# Patient Record
Sex: Female | Born: 1960 | Race: White | Hispanic: No | Smoking: Current every day smoker
Health system: Southern US, Community
[De-identification: ages and names within clinical notes are randomized; demographics above are authoritative.]

## PROBLEM LIST (undated history)

## (undated) DIAGNOSIS — F3181 Bipolar II disorder: Secondary | ICD-10-CM

## (undated) DIAGNOSIS — F329 Major depressive disorder, single episode, unspecified: Secondary | ICD-10-CM

## (undated) DIAGNOSIS — M25552 Pain in left hip: Secondary | ICD-10-CM

## (undated) DIAGNOSIS — F32A Depression, unspecified: Secondary | ICD-10-CM

## (undated) HISTORY — DX: Pain in left hip: M25.552

## (undated) HISTORY — DX: Major depressive disorder, single episode, unspecified: F32.9

## (undated) HISTORY — DX: Depression, unspecified: F32.A

## (undated) HISTORY — DX: Bipolar II disorder: F31.81

## (undated) HISTORY — DX: Other disorders of iron metabolism: E83.19

---

## 2017-06-01 DIAGNOSIS — R002 Palpitations: Secondary | ICD-10-CM | POA: Diagnosis not present

## 2017-06-02 ENCOUNTER — Ambulatory Visit: Payer: Self-pay | Admitting: Family Medicine

## 2017-07-12 ENCOUNTER — Ambulatory Visit: Payer: Self-pay | Admitting: Internal Medicine

## 2017-08-31 ENCOUNTER — Ambulatory Visit: Payer: Self-pay | Admitting: Internal Medicine

## 2018-01-24 ENCOUNTER — Ambulatory Visit: Payer: Commercial Managed Care - PPO | Admitting: Family Medicine

## 2018-01-24 ENCOUNTER — Encounter: Payer: Self-pay | Admitting: Family Medicine

## 2018-01-24 VITALS — BP 107/64 | HR 88 | Temp 98.8°F | Ht 68.3 in | Wt 172.8 lb

## 2018-01-24 DIAGNOSIS — H60331 Swimmer's ear, right ear: Secondary | ICD-10-CM

## 2018-01-24 DIAGNOSIS — M1612 Unilateral primary osteoarthritis, left hip: Secondary | ICD-10-CM | POA: Diagnosis not present

## 2018-01-24 DIAGNOSIS — Z79899 Other long term (current) drug therapy: Secondary | ICD-10-CM | POA: Diagnosis not present

## 2018-01-24 DIAGNOSIS — F3181 Bipolar II disorder: Secondary | ICD-10-CM | POA: Diagnosis not present

## 2018-01-24 LAB — UA/M W/RFLX CULTURE, ROUTINE
Bilirubin, UA: NEGATIVE
Glucose, UA: NEGATIVE
Ketones, UA: NEGATIVE
Leukocytes, UA: NEGATIVE
Nitrite, UA: NEGATIVE
Protein, UA: NEGATIVE
RBC, UA: NEGATIVE
Specific Gravity, UA: 1.01 (ref 1.005–1.030)
Urobilinogen, Ur: 0.2 mg/dL (ref 0.2–1.0)
pH, UA: 5.5 (ref 5.0–7.5)

## 2018-01-24 LAB — BAYER DCA HB A1C WAIVED: HB A1C (BAYER DCA - WAIVED): 4.6 % (ref <7.0)

## 2018-01-24 MED ORDER — CIPROFLOXACIN-DEXAMETHASONE 0.3-0.1 % OT SUSP: 4.0000 [drp] | Freq: Two times a day (BID) | OTIC | 0 refills | Status: DC

## 2018-01-24 MED ORDER — IBUPROFEN 800 MG PO TABS: ORAL_TABLET | ORAL | 6 refills | Status: DC

## 2018-01-24 NOTE — Assessment & Plan Note (Signed)
Doing well with ibuprofen. Refill given today. Call if she'd like to see ortho.

## 2018-01-24 NOTE — Progress Notes (Signed)
BP 107/64   Pulse 88   Temp 98.8 F (37.1 C) (Oral)   Ht 5' 8.3" (1.735 m)   Wt 172 lb 12.8 oz (78.4 kg)   SpO2 95%   BMI 26.04 kg/m    Subjective:    Patient ID: Laura Haney, female    DOB: 07-07-1960, 57 y.o.   MRN: [id number]  HPI: Laura Haney is a 57 y.o. female who presents today to establish care,   Chief Complaint  Patient presents with  . Establish Care    pt states she would like her ears checked, states she noticed an odor from them    EAR SMELL Duration: 6 months Involved ear(s): bilateral Severity:  Itching only  Quality:  itching Fever: no Otorrhea: yes Upper respiratory infection symptoms: no Pruritus: yes Hearing loss: no Water immersion no Using Q-tips: no Recurrent otitis media: no Status: stable Treatments attempted: none   Known arthritis in her L hip- has been doing well with it as long as she takes her ibuprofen.  Known bipolar- was treated in [place]. Call with any concerns.    Active Ambulatory Problems    Diagnosis Date Noted  . Bipolar 2 disorder (HCC) 01/24/2018  . Primary osteoarthritis of left hip 01/24/2018   Resolved Ambulatory Problems    Diagnosis Date Noted  . No Resolved Ambulatory Problems   Past Medical History:  Diagnosis Date  . Depression   . Iron excess   . Left hip pain    History reviewed. No pertinent surgical history.  Outpatient Encounter Medications as of 01/24/2018  Medication Sig  . DULoxetine (CYMBALTA) 60 MG capsule TK 1 C PO QAM  . ibuprofen (ADVIL,MOTRIN) 800 MG tablet TK 1 T PO TID AFTER MEALS  . lamoTRIgine (LAMICTAL) 150 MG tablet TK 1 T PO QHS  . QUEtiapine (SEROQUEL) 100 MG tablet TK 2 TS PO QHS  . [DISCONTINUED] ibuprofen (ADVIL,MOTRIN) 800 MG tablet TK 1 T PO TID AFTER MEALS  . ciprofloxacin-dexamethasone (CIPRODEX) OTIC suspension Place 4 drops into the right ear 2 (two) times daily.   No facility-administered encounter medications on file as of 01/24/2018.    Allergies  Allergen  Reactions  . Penicillins Other (See Comments)    Bad yeast infections   Social History   Socioeconomic History  . Marital status: Unknown    Spouse name: Not on file  . Number of children: Not on file  . Years of education: Not on file  . Highest education level: Not on file  Occupational History  . Not on file  Social Needs  . Financial resource strain: Not on file  . Food insecurity:    Worry: Not on file    Inability: Not on file  . Transportation needs:    Medical: Not on file    Non-medical: Not on file  Tobacco Use  . Smoking status: Former Smoker    Packs/day: 1.50    Types: Cigarettes  . Smokeless tobacco: Never Used  Substance and Sexual Activity  . 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 Drug use: Yes    Types: Marijuana    Comment: occasionally  . Sexual activity: Yes  Lifestyle  . Physical activity:    Days per week: Not on file    Minutes per session: Not on file  . Stress: Not on file  Relationships  . Social connections:    Talks on phone: Not on file    Gets together:  Not on file    Attends religious service: Not on file    Active member of club or organization: Not on file    Attends meetings of clubs or organizations: Not on file    Relationship status: Not on file  Other Topics Concern  . Not on file  Social History Narrative  . Not on file   Family History  Problem Relation Age of Onset  . Heart disease Mother   . Cancer Brother        pancreatic  . Heart attack Paternal Grandfather     Review of Systems  Constitutional: Negative.   HENT: Negative for trouble swallowing and voice change.   Respiratory: Negative.   Cardiovascular: Negative.   Musculoskeletal: Negative.   Psychiatric/Behavioral: Negative.     Per HPI unless specifically indicated above     Objective:    BP 107/64   Pulse 88   Temp 98.8 F (37.1 C) (Oral)   Ht 5' 8.3" (1.735 m)   Wt 172 lb 12.8 oz (78.4 kg)   SpO2 95%   BMI  26.04 kg/m   Wt Readings from Last 3 Encounters:  01/24/18 172 lb 12.8 oz (78.4 kg)    Physical Exam  Constitutional: She is oriented to person, place, and time. She appears well-developed and well-nourished. No distress.  HENT:  Head: Normocephalic and atraumatic.  Right Ear: Hearing normal.  Left Ear: Hearing and external ear normal.  Nose: Nose normal.  Mouth/Throat: Oropharynx is clear and moist. No oropharyngeal exudate.  Pus and swelling in R EAC  Eyes: Pupils are equal, round, and reactive to light. Conjunctivae, EOM and lids are normal. Right eye exhibits no discharge. Left eye exhibits no discharge. No scleral icterus.  Neck: Normal range of motion. Neck supple. No JVD present. No tracheal deviation present. No thyromegaly present.  Cardiovascular: Normal rate, regular rhythm, normal heart sounds and intact distal pulses. Exam reveals no gallop and no friction rub.  No murmur heard. Pulmonary/Chest: Effort normal and breath sounds normal. No stridor. No respiratory distress. She has no wheezes. She has no rales. She exhibits no tenderness.  Musculoskeletal: Normal range of motion.  Lymphadenopathy:    She has no cervical adenopathy.  Neurological: She is alert and oriented to person, place, and time.  Skin: Skin is warm, dry and intact. Capillary refill takes less than 2 seconds. No rash noted. No erythema. No pallor.  Psychiatric: She has a normal mood and affect. Her speech is normal and behavior is normal. Judgment and thought content normal. Cognition and memory are normal.  Nursing note and vitals reviewed.   No results found for this or any previous visit.    Assessment & Plan:   Problem List Items Addressed This Visit      Musculoskeletal and Integument   Primary osteoarthritis of left hip    Doing well with ibuprofen. Refill given today. Call if she'd like to see ortho.      Relevant Medications   ibuprofen (ADVIL,MOTRIN) 800 MG tablet     Other   Bipolar 2  disorder (HCC)    Stable and doing well. Was seeing psychiatry in [place]. Needs one here. Referral generated today.      Relevant Orders   Bayer DCA Hb A1c Waived   CBC with Differential/Platelet   Comprehensive metabolic panel   Lipid Panel w/o Chol/HDL Ratio   TSH   UA/M w/rflx Culture, Routine   Ambulatory referral to Psychiatry    Other Visit  Diagnoses    Acute swimmer's ear of right side    -  Primary   Will treat with ciprodex. Call with any concerns.    Long-term use of high-risk medication       Checking labs today. Await results.    Relevant Orders   Bayer DCA Hb A1c Waived   CBC with Differential/Platelet   Comprehensive metabolic panel   Lipid Panel w/o Chol/HDL Ratio   TSH   UA/M w/rflx Culture, Routine       Follow up plan: Return in about 6 months (around 07/26/2018) for Physical.

## 2018-01-24 NOTE — Patient Instructions (Signed)
Debrox- ear wax remover 

## 2018-01-24 NOTE — Assessment & Plan Note (Signed)
Stable and doing well. Was seeing psychiatry in [place]. Needs one here. Referral generated today.

## 2018-01-25 ENCOUNTER — Encounter: Payer: Self-pay | Admitting: Family Medicine

## 2018-01-25 LAB — COMPREHENSIVE METABOLIC PANEL
ALT: 11 IU/L (ref 0–32)
AST: 17 IU/L (ref 0–40)
Albumin/Globulin Ratio: 2.5 — ABNORMAL HIGH (ref 1.2–2.2)
Albumin: 4.2 g/dL (ref 3.5–5.5)
Alkaline Phosphatase: 82 IU/L (ref 39–117)
BUN/Creatinine Ratio: 13 (ref 9–23)
BUN: 12 mg/dL (ref 6–24)
Bilirubin Total: 0.4 mg/dL (ref 0.0–1.2)
CO2: 23 mmol/L (ref 20–29)
Calcium: 9 mg/dL (ref 8.7–10.2)
Chloride: 103 mmol/L (ref 96–106)
Creatinine, Ser: 0.89 mg/dL (ref 0.57–1.00)
GFR calc Af Amer: 84 mL/min/{1.73_m2} (ref >59)
GFR calc non Af Amer: 73 mL/min/{1.73_m2} (ref >59)
Globulin, Total: 1.7 g/dL (ref 1.5–4.5)
Glucose: 88 mg/dL (ref 65–99)
Potassium: 4.3 mmol/L (ref 3.5–5.2)
Sodium: 141 mmol/L (ref 134–144)
Total Protein: 5.9 g/dL — ABNORMAL LOW (ref 6.0–8.5)

## 2018-01-25 LAB — CBC WITH DIFFERENTIAL/PLATELET
Basophils Absolute: 0.1 10*3/uL (ref 0.0–0.2)
Basos: 2 %
EOS (ABSOLUTE): 0 10*3/uL (ref 0.0–0.4)
Eos: 0 %
Hematocrit: 34.7 % (ref 34.0–46.6)
Hemoglobin: 12.6 g/dL (ref 11.1–15.9)
Immature Grans (Abs): 0 10*3/uL (ref 0.0–0.1)
Immature Granulocytes: 0 %
Lymphocytes Absolute: 1.2 10*3/uL (ref 0.7–3.1)
Lymphs: 27 %
MCH: 34.4 pg — ABNORMAL HIGH (ref 26.6–33.0)
MCHC: 36.3 g/dL — ABNORMAL HIGH (ref 31.5–35.7)
MCV: 95 fL (ref 79–97)
Monocytes Absolute: 0.4 10*3/uL (ref 0.1–0.9)
Monocytes: 8 %
Neutrophils Absolute: 2.9 10*3/uL (ref 1.4–7.0)
Neutrophils: 63 %
PLATELETS: 176 10*3/uL (ref 150–450)
RBC: 3.66 x10E6/uL — AB (ref 3.77–5.28)
RDW: 12.9 % (ref 12.3–15.4)
WBC: 4.6 10*3/uL (ref 3.4–10.8)

## 2018-01-25 LAB — LIPID PANEL W/O CHOL/HDL RATIO
Cholesterol, Total: 210 mg/dL — ABNORMAL HIGH (ref 100–199)
HDL: 61 mg/dL (ref >39)
LDL CALC: 120 mg/dL — AB (ref 0–99)
Triglycerides: 145 mg/dL (ref 0–149)
VLDL CHOLESTEROL CAL: 29 mg/dL (ref 5–40)

## 2018-01-25 LAB — TSH: TSH: 2.38 u[IU]/mL (ref 0.450–4.500)

## 2018-03-22 ENCOUNTER — Telehealth: Payer: Self-pay | Admitting: Family Medicine

## 2018-03-22 NOTE — Telephone Encounter (Signed)
This referral was placed on 01/24/18- patient has heard NOTHING. Please check on this.

## 2018-03-22 NOTE — Telephone Encounter (Signed)
Copied from CRM [id number]. Topic: Referral - Question >> Mar 22, 2018 10:28 AM Gaynelle Adu wrote: Reason for CRM:  patient is requesting to have a referral for a Psychiatric location near her. She stated she travels to [place], [place] and would like something closer. Please advise

## 2018-03-25 NOTE — Telephone Encounter (Signed)
She does not need referral to [facility]. She is currently seeing psychiatry in [place] and does not want to drive that far. Can we let patient know that [facility] is working on it and please find out rough time line on them as it has been just about 2 months.

## 2018-03-31 NOTE — Telephone Encounter (Signed)
Pt called in to follow up on referral. Confirmed with Gibson Ramp - referral coordinator, that referral was placed at [facility]. Advised pt and also provided phone number.

## 2018-04-01 NOTE — Telephone Encounter (Signed)
Looks like she is now referred to psychiatry in [place]. I do not need her referral to [facility]. Need her referred to [facility]. Can we please check on this.

## 2018-04-08 NOTE — Telephone Encounter (Signed)
I called patient and left her a VM letting her know that her referral is at [facility], they are behind but will try to look at her notes. I left her [facility] phone number [phone] for her to call them and check on her referral

## 2018-04-08 NOTE — Telephone Encounter (Signed)
Laura Haney,    Please contact the patient to inform them of this information.  Also provide the phone number to [facility] and be sure to ask the patient if she would like to be referred to another office due to the delay.

## 2018-04-21 ENCOUNTER — Telehealth: Payer: Self-pay | Admitting: Family Medicine

## 2018-04-21 NOTE — Telephone Encounter (Signed)
Called and left VM for [facility] requesting call back about referral

## 2018-04-21 NOTE — Telephone Encounter (Signed)
error 

## 2018-04-21 NOTE — Telephone Encounter (Signed)
Can we please find out a time line for this patient? I'm happy to refer her elsewhere if she's OK with driving to [facility] or [place]. I'm also happy to send her to Dr. Maryruth Bun if she takes her insurance.   It's been almost 3 months on this referral and I'd like to see what we can do to help this patient.

## 2018-04-21 NOTE — Telephone Encounter (Signed)
Called and left detailed message with information on patients vm. DPR was checked.   

## 2018-04-21 NOTE — Telephone Encounter (Signed)
Copied from CRM #[id number]. Topic: Referral - Request for Referral >> Apr 21, 2018  9:24 AM Alexander, Amber L wrote: Has patient seen PCP for this complaint? Yes - states this was discussed at initial visit *If NO, is insurance requiring patient see PCP for this issue before PCP can refer them? Referral for which specialty: psychiatry Preferred provider/office: [facility] Reason for referral: needs new psychiatrist  Pt can be reached at [phone] 

## 2018-04-21 NOTE — Telephone Encounter (Deleted)
Copied from CRM [id number]. Topic: Referral - Request for Referral >> Apr 21, 2018  9:24 AM Baldo Daub L wrote: Has patient seen PCP for this complaint? Yes - states this was discussed at initial visit *If NO, is insurance requiring patient see PCP for this issue before PCP can refer them? Referral for which specialty: psychiatry Preferred provider/office: [facility] Reason for referral: needs new psychiatrist  Pt can be reached at [phone]

## 2018-04-22 NOTE — Telephone Encounter (Signed)
Called and spoke w/ Nedra Hai @ [facility]. She stated that she had spoken to patient yesterday and they were going to have to get her prior psychiatry records before they could see her. Nedra Hai stated pt was going to work on getting those.

## 2018-05-04 NOTE — Telephone Encounter (Signed)
Patient states she can not get in touch with [facility] at all - she wants to know if Dr Laural Benes knows the status on this. She said she is going to be running out of her medication soon and is worried because she can not get them on the phone and has not yet been seen. Please advise. Call back @ [phone]

## 2018-05-04 NOTE — Telephone Encounter (Signed)
Can you take a look at this? 

## 2018-05-13 ENCOUNTER — Emergency Department: Payer: Commercial Managed Care - PPO

## 2018-05-13 ENCOUNTER — Encounter: Payer: Self-pay | Admitting: Emergency Medicine

## 2018-05-13 ENCOUNTER — Other Ambulatory Visit: Payer: Self-pay

## 2018-05-13 ENCOUNTER — Emergency Department (HOSPITAL_COMMUNITY)
Admission: EM | Admit: 2018-05-13 | Discharge: 2018-05-14 | Disposition: A | Discharge status: Other Acute Inpatient Hospital | Payer: Commercial Managed Care - PPO | Source: Home / Self Care | Attending: Emergency Medicine | Admitting: Emergency Medicine

## 2018-05-13 DIAGNOSIS — Z8249 Family history of ischemic heart disease and other diseases of the circulatory system: Secondary | ICD-10-CM | POA: Diagnosis not present

## 2018-05-13 DIAGNOSIS — Y999 Unspecified external cause status: Secondary | ICD-10-CM

## 2018-05-13 DIAGNOSIS — Y907 Blood alcohol level of 200-239 mg/100 ml: Secondary | ICD-10-CM | POA: Insufficient documentation

## 2018-05-13 DIAGNOSIS — S60222A Contusion of left hand, initial encounter: Secondary | ICD-10-CM

## 2018-05-13 DIAGNOSIS — Y939 Activity, unspecified: Secondary | ICD-10-CM | POA: Insufficient documentation

## 2018-05-13 DIAGNOSIS — F1010 Alcohol abuse, uncomplicated: Secondary | ICD-10-CM

## 2018-05-13 DIAGNOSIS — F32A Depression, unspecified: Secondary | ICD-10-CM

## 2018-05-13 DIAGNOSIS — F329 Major depressive disorder, single episode, unspecified: Secondary | ICD-10-CM

## 2018-05-13 DIAGNOSIS — F17210 Nicotine dependence, cigarettes, uncomplicated: Secondary | ICD-10-CM

## 2018-05-13 DIAGNOSIS — Y929 Unspecified place or not applicable: Secondary | ICD-10-CM | POA: Insufficient documentation

## 2018-05-13 DIAGNOSIS — W19XXXA Unspecified fall, initial encounter: Secondary | ICD-10-CM

## 2018-05-13 DIAGNOSIS — R45851 Suicidal ideations: Secondary | ICD-10-CM

## 2018-05-13 DIAGNOSIS — F3181 Bipolar II disorder: Secondary | ICD-10-CM | POA: Diagnosis not present

## 2018-05-13 DIAGNOSIS — X58XXXA Exposure to other specified factors, initial encounter: Secondary | ICD-10-CM | POA: Insufficient documentation

## 2018-05-13 DIAGNOSIS — M79642 Pain in left hand: Secondary | ICD-10-CM | POA: Diagnosis not present

## 2018-05-13 DIAGNOSIS — Z79899 Other long term (current) drug therapy: Secondary | ICD-10-CM | POA: Insufficient documentation

## 2018-05-13 LAB — CBC
HCT: 39.4 % (ref 36.0–46.0)
Hemoglobin: 14.1 g/dL (ref 12.0–15.0)
MCH: 34.6 pg — ABNORMAL HIGH (ref 26.0–34.0)
MCHC: 35.8 g/dL (ref 30.0–36.0)
MCV: 96.8 fL (ref 80.0–100.0)
Platelets: 169 10*3/uL (ref 150–400)
RBC: 4.07 MIL/uL (ref 3.87–5.11)
RDW: 13.8 % (ref 11.5–15.5)
WBC: 5.7 10*3/uL (ref 4.0–10.5)
nRBC: 0 % (ref 0.0–0.2)

## 2018-05-13 LAB — URINE DRUG SCREEN, QUALITATIVE (ARMC ONLY)
Amphetamines, Ur Screen: NOT DETECTED
Barbiturates, Ur Screen: NOT DETECTED
Benzodiazepine, Ur Scrn: NOT DETECTED
CANNABINOID 50 NG, UR ~~LOC~~: NOT DETECTED
Cocaine Metabolite,Ur ~~LOC~~: NOT DETECTED
MDMA (Ecstasy)Ur Screen: NOT DETECTED
Methadone Scn, Ur: NOT DETECTED
Opiate, Ur Screen: NOT DETECTED
Phencyclidine (PCP) Ur S: NOT DETECTED
Tricyclic, Ur Screen: NOT DETECTED

## 2018-05-13 LAB — SALICYLATE LEVEL: Salicylate Lvl: <7 mg/dL (ref 2.8–30.0)

## 2018-05-13 LAB — COMPREHENSIVE METABOLIC PANEL
ALT: 16 U/L (ref 0–44)
AST: 26 U/L (ref 15–41)
Albumin: 4.5 g/dL (ref 3.5–5.0)
Alkaline Phosphatase: 89 U/L (ref 38–126)
Anion gap: 13 (ref 5–15)
BUN: 9 mg/dL (ref 6–20)
CO2: 21 mmol/L — ABNORMAL LOW (ref 22–32)
Calcium: 8.7 mg/dL — ABNORMAL LOW (ref 8.9–10.3)
Chloride: 101 mmol/L (ref 98–111)
Creatinine, Ser: 0.8 mg/dL (ref 0.44–1.00)
GFR calc Af Amer: >60 mL/min (ref >60)
Glucose, Bld: 94 mg/dL (ref 70–99)
Potassium: 4 mmol/L (ref 3.5–5.1)
Sodium: 135 mmol/L (ref 135–145)
Total Bilirubin: 0.7 mg/dL (ref 0.3–1.2)
Total Protein: 6.9 g/dL (ref 6.5–8.1)

## 2018-05-13 LAB — ACETAMINOPHEN LEVEL: Acetaminophen (Tylenol), Serum: <10 ug/mL — ABNORMAL LOW (ref 10–30)

## 2018-05-13 LAB — ETHANOL: Alcohol, Ethyl (B): 237 mg/dL — ABNORMAL HIGH (ref <10)

## 2018-05-13 MED ORDER — LAMOTRIGINE 100 MG PO TABS
150.0000 mg | ORAL_TABLET | Freq: Every day | ORAL | Status: DC
  Administered 2018-05-13: 150 mg via ORAL
  Filled 2018-05-13: qty 2

## 2018-05-13 MED ORDER — IBUPROFEN 800 MG PO TABS
800.0000 mg | ORAL_TABLET | Freq: Three times a day (TID) | ORAL | Status: DC
  Administered 2018-05-13 – 2018-05-14 (×3): 800 mg via ORAL
  Filled 2018-05-13 (×4): qty 1

## 2018-05-13 MED ORDER — QUETIAPINE FUMARATE 200 MG PO TABS
200.0000 mg | ORAL_TABLET | Freq: Every day | ORAL | Status: DC
  Administered 2018-05-13: 200 mg via ORAL
  Filled 2018-05-13: qty 1

## 2018-05-13 NOTE — ED Notes (Signed)
Patient asking, "What's next?"  RN explained the doctor wants her to spend the night in [facility] and be re-assessed in the morning. Pt accepting. Maintained on 15 minute checks and observation by security camera for safety.

## 2018-05-13 NOTE — ED Provider Notes (Signed)
[facility] Emergency Department Provider Note   ____________________________________________   First MD Initiated Contact with Patient 05/13/18 1449     (approximate)  I have reviewed the triage vital signs and the nursing notes.   HISTORY  Chief Complaint Hand Pain and Suicidal   HPI Laura Haney is a 58 y.o. female who comes in reporting that she is an alcoholic.  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  She is very depressed because her mother-in-law got her sober initially and now her mother-in-law has multiple medical problems and needs her help and she just cannot help her.  She tripped and fell today and hurt her hand.  Left hand is bruised swollen and tender.  Patient told the nurse she really does not want to be here anymore she cannot wanted to end it all.  She tells me she is not homicidal or suicidal just very very depressed and does not want to go home.  She needs to be on to help her mother-in-law when she gets home.  When she fell she only hurt her hand she did not hit her head or neck.  She has no headache.         Past Medical History:  Diagnosis Date  . Bipolar 2 disorder (HCC)   . Depression   . Iron excess   . Left hip pain     Patient Active Problem List   Diagnosis Date Noted  . Suicidal ideation 05/13/2018  . Bipolar 2 disorder (HCC) 01/24/2018  . Primary osteoarthritis of left hip 01/24/2018    History reviewed. No pertinent surgical history.  Prior to Admission medications   Medication Sig Start Date End Date Taking? Authorizing Provider  ciprofloxacin-dexamethasone (CIPRODEX) OTIC suspension Place 4 drops into the right ear 2 (two) times daily. 01/24/18   Johnson, Megan P, DO  DULoxetine (CYMBALTA) 60 MG capsule TK 1 C PO QAM 11/09/17   [provider]  ibuprofen (ADVIL,MOTRIN) 800 MG tablet TK 1 T PO TID AFTER MEALS 01/24/18   Johnson, Megan P, DO   lamoTRIgine (LAMICTAL) 150 MG tablet TK 1 T PO QHS 01/13/18   [provider]  QUEtiapine (SEROQUEL) 100 MG tablet TK 2 TS PO QHS 11/28/17   [provider]    Allergies Penicillins  Family History  Problem Relation Age of Onset  . Heart disease Mother   . Cancer Brother        pancreatic  . Heart attack Paternal Grandfather     Social History Social History   Tobacco Use  . Smoking status: Current Every Day Smoker    Packs/day: 1.50    Types: Cigarettes  . Smokeless tobacco: Never Used  Substance Use Topics  . Alcohol use: Yes  . Drug use: Not Currently    Types: Marijuana    Comment: occasionally    Review of Systems  Constitutional: No fever/chills Eyes: No visual changes. ENT: No sore throat. Cardiovascular: Denies chest pain. Respiratory: Denies shortness of breath. Gastrointestinal: No abdominal pain.  No nausea, no vomiting.  No diarrhea.  No constipation. Genitourinary: Negative for dysuria. Musculoskeletal: Negative for back pain. Skin: Negative for rash. Neurological: Negative for headaches, focal weakness   ____________________________________________   PHYSICAL EXAM:  VITAL SIGNS: ED Triage Vitals  Enc Vitals Group     BP --      Pulse --  Resp --      Temp --      Temp src --      SpO2 --      Weight 05/13/18 1249 170 lb (77.1 kg)     Height 05/13/18 1249  (1.727 m)     Head Circumference --      Peak Flow --      Pain Score 05/13/18 1248 5     Pain Loc --      Pain Edu? --      Excl. in GC? --     Constitutional: Alert and oriented.  Looks sad Eyes: Conjunctivae are normal.  Head: Atraumatic. Nose: No congestion/rhinnorhea. Mouth/Throat: Mucous membranes are moist.  Oropharynx non-erythematous. Neck: No stridor.  Cardiovascular: Normal rate, regular rhythm. Grossly normal heart sounds.  Good peripheral circulation. Respiratory: Normal respiratory effort.  No retractions. Lungs CTAB. Gastrointestinal:  Soft and nontender. No distention. No abdominal bruits. No CVA tenderness. Musculoskeletal: No lower extremity tenderness nor edema.  Left hand is bruised and swollen but has full range of motion of all joints. Neurologic:  Normal speech and language. No gross focal neurologic deficits are appreciated. . Skin:  Skin is warm, dry and intact. No rash noted   ____________________________________________   LABS (all labs ordered are listed, but only abnormal results are displayed)  Labs Reviewed  COMPREHENSIVE METABOLIC PANEL - Abnormal; Notable for the following components:      Result Value   CO2 21 (*)    Calcium 8.7 (*)    All other components within normal limits  ETHANOL - Abnormal; Notable for the following components:   Alcohol, Ethyl (B) 237 (*)    All other components within normal limits  ACETAMINOPHEN LEVEL - Abnormal; Notable for the following components:   Acetaminophen (Tylenol), Serum <10 (*)    All other components within normal limits  CBC - Abnormal; Notable for the following components:   MCH 34.6 (*)    All other components within normal limits  SALICYLATE LEVEL  URINE DRUG SCREEN, QUALITATIVE ([facility] ONLY)   ____________________________________________  EKG   ____________________________________________  RADIOLOGY  ED MD interpretation: xray read by radiology reviewed by me shows no apparent fracture  Official radiology report(s): Dg Hand Complete Left  Result Date: 05/13/2018 CLINICAL DATA:  Pain with questionable fall EXAM: LEFT HAND - COMPLETE 3+ VIEW COMPARISON:  None. FINDINGS: Frontal, oblique, and lateral views obtained. There is no evident acute fracture or dislocation. A small focus of calcification is noted in the lateral aspect of the third DIP joint which appears well corticated. Joint spaces appear normal. No erosive change. IMPRESSION: No acute fracture or dislocation. Small calcification in the lateral aspect of third DIP joint may represent  residua of old trauma or have arthropathic etiology. Note that there is no appreciable joint space narrowing or erosive change. Electronically Signed   By: Bretta Bang III M.D.   On: 05/13/2018 13:28    ____________________________________________   PROCEDURES  Procedure(s) performed (including Critical Care):  Procedures   ____________________________________________   INITIAL IMPRESSION / ASSESSMENT AND PLAN / ED COURSE  I will consult psychiatry to see if we can help this lady.      ____________________________________________   FINAL CLINICAL IMPRESSION(S) / ED DIAGNOSES  Final diagnoses:  Fall, initial encounter  Contusion of left hand, initial encounter  Depression, unspecified depression type     ED Discharge Orders    None       Note:  This document  was prepared using Conservation officer, historic buildings and may include unintentional dictation errors.    Arnaldo Natal, MD 05/13/18 [phone]

## 2018-05-13 NOTE — Consult Note (Signed)
[facility] Face-to-Face Psychiatry Consult   Reason for Consult: Suicidal ideation Referring Physician: Dr. Sharma Covert Patient Identification: Laura Haney MRN:  [id number] Principal Diagnosis: Suicidal ideation Diagnosis:  Principal Problem:   Suicidal ideation   Total Time spent with patient: 1 hour  Subjective: "I cannot keep living like this; I need help"  Laura Haney is a 58 y.o. female patient presented to [facility] ED voluntarily.  The patient was seen face-to-face by this provider; chart reviewed and consulted with Dr.  Sharma Covert on 05/13/2018 due to the care of the patient. It was discussed with the provider that the patient does not meet criteria to be admitted to the inpatient unit.  It was also discussed with Dr. Nelva Bush that the patient alcohol level 237 mg/dL it was suggested that the patient be observed overnight and tomorrow the patient will be cleared for discharged.  The patient states she stopped drinking for a few years with the help of her mother-in-law.  She also voiced currently, her mother-in-law lives with them and that has added more stress into her life.   The patient is currently in endosing suicidal ideation.  "I feel like I want to die because I am drunk."  The patient states that she cannot live like this and she knows that she will not hurt herself but she wants to stop drinking.  "I am an alcoholic I have been an alcoholic for many many years."  The patient did admit to active suicidal attempt in 2014.  "In 2014, I took shit load of trazodone and got into my bathtub and my husband found me."  "If he had not found me I would not be here today."  "I want to get clean.  I know if I leave here I will not go to outpatient substance abuse treatment place." On evaluation the patient is alert and oriented x3, calm and cooperative, and mood-congruent with affect. The patient was drinking fell as hurt her left hand. Ice applied The patient does not appear to be responding to internal or external  stimuli. Neither is the patient presenting with any delusional thinking. The patient currently admits to being suicidal, but without a plan. She denies homicidal ideation. The patient is not presenting with any psychotic or paranoid behaviors. During an encounter with the patient, she was able to answer some questions appropriately. Collateral was not obtained. Collateral should be obtained before patient is discharged       Plan: The patient is not a safety risk once she has sober up. The patient does not need inpatient admission. The patient can benefit from inpatient or out patient substance use treatment for stabilization of her alcohol used. The patient alcohol level 237 mg/dl.  HPI:  Per Dr. Darnelle Catalan; Laura Haney is a 58 y.o. female who comes in reporting that she is an alcoholic.  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  She is very depressed because her mother-in-law got her sober initially and now her mother-in-law has multiple medical problems and needs her help and she just cannot help her.  She tripped and fell today and hurt her hand.  Left hand is bruised swollen and tender.  Patient told the nurse she really does not want to be here anymore she cannot wanted to end it all.  She tells me she is not homicidal or suicidal just very very depressed and does not want to go home.  She  needs to be on to help her mother-in-law when she gets home.  When she fell she only hurt her hand she did not hit her head or neck.  She has no headache.      Past Psychiatric History:   Risk to Self:   Risk to Others:   Prior Inpatient Therapy:   Prior Outpatient Therapy:    Past Medical History:  Past Medical History:  Diagnosis Date  . Bipolar 2 disorder (HCC)   . Depression   . Iron excess   . Left hip pain    History reviewed. No pertinent surgical history. Family History:  Family History  Problem Relation Age of Onset  . Heart  disease Mother   . Cancer Brother        pancreatic  . Heart attack Paternal Grandfather    Family Psychiatric  History:  Bipolar 2 disorder  depression Social History:  Social History   Substance and Sexual Activity  Alcohol Use Yes     Social History   Substance and Sexual Activity  Drug Use Not Currently  . Types: Marijuana   Comment: occasionally    Social History   Socioeconomic History  . Marital status: Married    Spouse name: Not on file  . Number of children: Not on file  . Years of education: Not on file  . Highest education level: Not on file  Occupational History  . Not on file  Social Needs  . Financial resource strain: Not on file  . Food insecurity:    Worry: Not on file    Inability: Not on file  . Transportation needs:    Medical: Not on file    Non-medical: Not on file  Tobacco Use  . Smoking status: Current Every Day Smoker    Packs/day: 1.50    Types: Cigarettes  . Smokeless tobacco: Never Used  Substance and Sexual Activity  . Alcohol use: Yes  . Drug use: Not Currently    Types: Marijuana    Comment: occasionally  . Sexual activity: Yes  Lifestyle  . Physical activity:    Days per week: Not on file    Minutes per session: Not on file  . Stress: Not on file  Relationships  . Social connections:    Talks on phone: Not on file    Gets together: Not on file    Attends religious service: Not on file    Active member of club or organization: Not on file    Attends meetings of clubs or organizations: Not on file    Relationship status: Not on file  Other Topics Concern  . Not on file  Social History Narrative  . Not on file   Additional Social History:    Allergies:   Allergies  Allergen Reactions  . Penicillins Other (See Comments)    Bad yeast infections    Labs:  Results for orders placed or performed during the hospital encounter of 05/13/18 (from the past 48 hour(s))  Urine Drug Screen, Qualitative     Status: None    Collection Time: 05/13/18 12:47 PM  Result Value Ref Range   Tricyclic, Ur Screen NONE DETECTED NONE DETECTED   Amphetamines, Ur Screen NONE DETECTED NONE DETECTED   MDMA (Ecstasy)Ur Screen NONE DETECTED NONE DETECTED   Cocaine Metabolite,Ur [place] NONE DETECTED NONE DETECTED   Opiate, Ur Screen NONE DETECTED NONE DETECTED   Phencyclidine (PCP) Ur S NONE DETECTED NONE DETECTED   Cannabinoid 50 Ng, Ur [place]  NONE DETECTED NONE DETECTED   Barbiturates, Ur Screen NONE DETECTED NONE DETECTED   Benzodiazepine, Ur Scrn NONE DETECTED NONE DETECTED   Methadone Scn, Ur NONE DETECTED NONE DETECTED    Comment: (NOTE) Tricyclics + metabolites, urine    Cutoff 1000 ng/mL Amphetamines + metabolites, urine  Cutoff 1000 ng/mL MDMA (Ecstasy), urine              Cutoff 500 ng/mL Cocaine Metabolite, urine          Cutoff 300 ng/mL Opiate + metabolites, urine        Cutoff 300 ng/mL Phencyclidine (PCP), urine         Cutoff 25 ng/mL Cannabinoid, urine                 Cutoff 50 ng/mL Barbiturates + metabolites, urine  Cutoff 200 ng/mL Benzodiazepine, urine              Cutoff 200 ng/mL Methadone, urine                   Cutoff 300 ng/mL The urine drug screen provides only a preliminary, unconfirmed analytical test result and should not be used for non-medical purposes. Clinical consideration and professional judgment should be applied to any positive drug screen result due to possible interfering substances. A more specific alternate chemical method must be used in order to obtain a confirmed analytical result. Gas chromatography / mass spectrometry (GC/MS) is the preferred confirmat ory method. Performed at [facility], [address] [place], [place] [address]   Comprehensive metabolic panel     Status: Abnormal   Collection Time: 05/13/18 12:51 PM  Result Value Ref Range   Sodium 135 135 - 145 mmol/L   Potassium 4.0 3.5 - 5.1 mmol/L   Chloride 101 98 - 111 mmol/L   CO2 21 (L) 22 - 32  mmol/L   Glucose, Bld 94 70 - 99 mg/dL   BUN 9 6 - 20 mg/dL   Creatinine, Ser 6.040.80 0.44 - 1.00 mg/dL   Calcium 8.7 (L) 8.9 - 10.3 mg/dL   Total Protein 6.9 6.5 - 8.1 g/dL   Albumin 4.5 3.5 - 5.0 g/dL   AST 26 15 - 41 U/L   ALT 16 0 - 44 U/L   Alkaline Phosphatase 89 38 - 126 U/L   Total Bilirubin 0.7 0.3 - 1.2 mg/dL   GFR calc non Af Amer >60 >60 mL/min   GFR calc Af Amer >60 >60 mL/min   Anion gap 13 5 - 15    Comment: Performed at [facility], [address] [place], [place] [address]  Ethanol     Status: Abnormal   Collection Time: 05/13/18 12:51 PM  Result Value Ref Range   Alcohol, Ethyl (B) 237 (H) <10 mg/dL    Comment: (NOTE) Lowest detectable limit for serum alcohol is 10 mg/dL. For medical purposes only. Performed at [facility], [address] [place], [place] [address]   Salicylate level     Status: None   Collection Time: 05/13/18 12:51 PM  Result Value Ref Range   Salicylate Lvl <7.0 2.8 - 30.0 mg/dL    Comment: Performed at [facility], [address] [place], [place] [address]  Acetaminophen level     Status: Abnormal   Collection Time: 05/13/18 12:51 PM  Result Value Ref Range   Acetaminophen (Tylenol), Serum <10 (L) 10 - 30 ug/mL    Comment: (NOTE) Therapeutic concentrations vary significantly. A  range of 10-30 ug/mL  may be an effective concentration for many patients. However, some  are best treated at concentrations outside of this range. Acetaminophen concentrations >150 ug/mL at 4 hours after ingestion  and >50 ug/mL at 12 hours after ingestion are often associated with  toxic reactions. Performed at [facility], [address] [place], [place] [address]   cbc     Status: Abnormal   Collection Time: 05/13/18 12:51 PM  Result Value Ref Range   WBC 5.7 4.0 - 10.5 K/uL   RBC 4.07 3.87 - 5.11 MIL/uL   Hemoglobin 14.1 12.0 - 15.0 g/dL   HCT 60.4 54.0 - 98.1 %   MCV 96.8 80.0 - 100.0 fL   MCH 34.6 (H)  26.0 - 34.0 pg   MCHC 35.8 30.0 - 36.0 g/dL   RDW 19.1 47.8 - 29.5 %   Platelets 169 150 - 400 K/uL   nRBC 0.0 0.0 - 0.2 %    Comment: Performed at [facility], [address] [place], [place] [address]    No current facility-administered medications for this encounter.    Current Outpatient Medications  Medication Sig Dispense Refill  . ciprofloxacin-dexamethasone (CIPRODEX) OTIC suspension Place 4 drops into the right ear 2 (two) times daily. 7.5 mL 0  . DULoxetine (CYMBALTA) 60 MG capsule TK 1 C PO QAM  0  . ibuprofen (ADVIL,MOTRIN) 800 MG tablet TK 1 T PO TID AFTER MEALS 90 tablet 6  . lamoTRIgine (LAMICTAL) 150 MG tablet TK 1 T PO QHS  0  . QUEtiapine (SEROQUEL) 100 MG tablet TK 2 TS PO QHS  0    Musculoskeletal: Strength & Muscle Tone: within normal limits Gait & Station: normal Patient leans: Backward  Psychiatric Specialty Exam: Physical Exam  Nursing note and vitals reviewed. Constitutional: She is oriented to person, place, and time. She appears well-developed and well-nourished.  HENT:  Head: Normocephalic and atraumatic.  Right Ear: External ear normal.  Left Ear: External ear normal.  Nose: Nose normal.  Neck: Normal range of motion. Neck supple.  Cardiovascular: Normal rate and regular rhythm.  Respiratory: Effort normal and breath sounds normal.  Musculoskeletal: Normal range of motion.        General: Tenderness and edema present.  Neurological: She is alert and oriented to person, place, and time. She has normal reflexes.  Skin: Skin is warm and dry.    Review of Systems  Constitutional: Negative.   HENT: Negative.   Eyes: Negative.   Respiratory: Negative.   Cardiovascular: Negative.   Gastrointestinal: Negative.   Genitourinary: Negative.   Musculoskeletal: Positive for joint pain.  Skin: Negative.   Neurological: Negative.   Psychiatric/Behavioral: Positive for depression, substance abuse and suicidal ideas. Negative for  hallucinations and memory loss. The patient is nervous/anxious. The patient does not have insomnia.     Height  (1.727 m), weight 77.1 kg.Body mass index is 25.85 kg/m.  General Appearance: Well Groomed  Eye Contact:  Good  Speech:  Clear and Coherent  Volume:  Normal  Mood:  Depressed, Hopeless and Worthless  Affect:  Depressed  Thought Process:  Coherent  Orientation:  Full (Time, Place, and Person)  Thought Content:  Illogical and Ilusions  Suicidal Thoughts:  Yes.  without intent/plan  Homicidal Thoughts:  No  Memory:  Recent;   Good  Judgement:  Negative  Insight:  Lacking  Psychomotor Activity:  NA  Concentration:  Concentration: Poor  Recall:  [employer] of  Knowledge:  Fair  Language:  Good  Akathisia:  NA  Handed:  Right  AIMS (if indicated):     Assets:  Housing Social Support  ADL's:  Intact  Cognition:  WNL  Sleep:   Insomnia     Treatment Plan Summary: Medication management  Disposition: No evidence of imminent risk to self or others at present.   Patient does not meet criteria for psychiatric inpatient admission. Supportive therapy provided about ongoing stressors. Refer to IOP. Discussed crisis plan, support from social network, calling 911, coming to the Emergency Department, and calling Suicide Hotline.  Catalina Gravel, NP 05/13/2018 4:07 PM

## 2018-05-13 NOTE — ED Notes (Signed)
Patient assigned to appropriate care area. Patient oriented to unit/care area: Informed that, for their safety, care areas are designed for safety and monitored by security cameras at all times; and visiting hours explained to patient. Patient verbalizes understanding, and verbal contract for safety obtained. 

## 2018-05-13 NOTE — ED Notes (Signed)
VOL/Consult completed/Pending Disposition 

## 2018-05-13 NOTE — ED Notes (Signed)
Pt given more ice for her left wrist/hand.

## 2018-05-13 NOTE — ED Triage Notes (Signed)
Pt to ED from home initially for left hand pain states had been drinking and thinks trying to put clothes on and fell, states does not remember, but has swelling to left knuckles.  While triaging patient states "I don't want to live, I could care less".  States has hx of psychiatric disorders: bipolar, depression, alcohol abuse.  States has tried killing herself with overdose of tramadol 3 plus years ago, denies plan today but just thoughts, denies HI.  States 6 12oz beers today.  Pt cooperative in triage, stating wants help.

## 2018-05-13 NOTE — ED Notes (Signed)
Psychiatry is currently at her bedside  

## 2018-05-13 NOTE — ED Notes (Signed)
Pt. Calm and cooperative in room, pt. Watching tv at this time.  Pt. Requesting daily meds for this evening.   Pt. Advised daily meds would be reviewed by this RN. Pt. Has cold pack for lt. Hand.  Pt. Advised to come to this nurse for any questions or concerns.

## 2018-05-13 NOTE — ED Notes (Signed)
"  Everyone will be better off if I die - my husband will be happier - my children will be happier - the only person I worry about is my Mom  - she is 50 and she stays at assisted living so I haven't been able to see her - My mom has already lost two sons so I am certain when my mom dies then I will just go ahead and do it.  Everyone will be happier   My mother in law lives with [place] and she is getting on my nerves so much that I have begun drinking again  - I got sober several years ago but I have been drinking more and more because of the added stress"

## 2018-05-13 NOTE — ED Notes (Signed)

## 2018-05-13 NOTE — ED Notes (Signed)
Pt given dinner tray. Hand hygiene encouraged.   Pt allowed to call husband. Maintained on 15 minute checks and observation by security camera for safety.

## 2018-05-13 NOTE — ED Notes (Signed)
Patient belongings:  Red zip up pull over, bra, black pants, black socks

## 2018-05-14 ENCOUNTER — Other Ambulatory Visit: Payer: Self-pay

## 2018-05-14 ENCOUNTER — Inpatient Hospital Stay
Admission: AD | Admit: 2018-05-14 | Discharge: 2018-05-18 | DRG: 885 | Disposition: A | Discharge status: Home / Self Care | Payer: Commercial Managed Care - PPO | Source: Intra-hospital | Attending: Psychiatry | Admitting: Psychiatry

## 2018-05-14 DIAGNOSIS — Z560 Unemployment, unspecified: Secondary | ICD-10-CM | POA: Diagnosis not present

## 2018-05-14 DIAGNOSIS — Z62810 Personal history of physical and sexual abuse in childhood: Secondary | ICD-10-CM | POA: Diagnosis present

## 2018-05-14 DIAGNOSIS — F3181 Bipolar II disorder: Principal | ICD-10-CM | POA: Diagnosis present

## 2018-05-14 DIAGNOSIS — R45851 Suicidal ideations: Secondary | ICD-10-CM | POA: Diagnosis present

## 2018-05-14 DIAGNOSIS — Z800 Family history of malignant neoplasm of digestive organs: Secondary | ICD-10-CM | POA: Diagnosis not present

## 2018-05-14 DIAGNOSIS — Z880 Allergy status to penicillin: Secondary | ICD-10-CM

## 2018-05-14 DIAGNOSIS — Z8249 Family history of ischemic heart disease and other diseases of the circulatory system: Secondary | ICD-10-CM

## 2018-05-14 DIAGNOSIS — Z915 Personal history of self-harm: Secondary | ICD-10-CM | POA: Diagnosis not present

## 2018-05-14 DIAGNOSIS — F10229 Alcohol dependence with intoxication, unspecified: Secondary | ICD-10-CM | POA: Diagnosis present

## 2018-05-14 DIAGNOSIS — Z79899 Other long term (current) drug therapy: Secondary | ICD-10-CM | POA: Diagnosis not present

## 2018-05-14 DIAGNOSIS — G4700 Insomnia, unspecified: Secondary | ICD-10-CM | POA: Diagnosis present

## 2018-05-14 DIAGNOSIS — F17210 Nicotine dependence, cigarettes, uncomplicated: Secondary | ICD-10-CM | POA: Diagnosis present

## 2018-05-14 DIAGNOSIS — F4310 Post-traumatic stress disorder, unspecified: Secondary | ICD-10-CM | POA: Diagnosis present

## 2018-05-14 DIAGNOSIS — F1010 Alcohol abuse, uncomplicated: Secondary | ICD-10-CM

## 2018-05-14 MED ORDER — LORAZEPAM 1 MG PO TABS
1.0000 mg | ORAL_TABLET | Freq: Four times a day (QID) | ORAL | Status: AC
  Administered 2018-05-14 – 2018-05-15 (×4): 1 mg via ORAL
  Filled 2018-05-14 (×4): qty 1

## 2018-05-14 MED ORDER — VITAMIN B-1 100 MG PO TABS
100.0000 mg | ORAL_TABLET | Freq: Every day | ORAL | Status: DC
  Administered 2018-05-15 – 2018-05-18 (×4): 100 mg via ORAL
  Filled 2018-05-14 (×4): qty 1

## 2018-05-14 MED ORDER — ADULT MULTIVITAMIN W/MINERALS CH
1.0000 | ORAL_TABLET | Freq: Every day | ORAL | Status: DC
  Administered 2018-05-14 – 2018-05-18 (×5): 1 via ORAL
  Filled 2018-05-14 (×5): qty 1

## 2018-05-14 MED ORDER — THIAMINE HCL 100 MG/ML IJ SOLN
100.0000 mg | Freq: Once | INTRAMUSCULAR | Status: AC
  Administered 2018-05-14: 100 mg via INTRAMUSCULAR
  Filled 2018-05-14: qty 2

## 2018-05-14 MED ORDER — ONDANSETRON 4 MG PO TBDP: 4.0000 mg | ORAL_TABLET | Freq: Four times a day (QID) | ORAL | Status: AC | PRN

## 2018-05-14 MED ORDER — LORAZEPAM 1 MG PO TABS: 1.0000 mg | ORAL_TABLET | Freq: Four times a day (QID) | ORAL | Status: AC | PRN

## 2018-05-14 MED ORDER — LORAZEPAM 1 MG PO TABS
1.0000 mg | ORAL_TABLET | Freq: Three times a day (TID) | ORAL | Status: AC
  Administered 2018-05-15 – 2018-05-16 (×3): 1 mg via ORAL
  Filled 2018-05-14 (×3): qty 1

## 2018-05-14 MED ORDER — QUETIAPINE FUMARATE 200 MG PO TABS
200.0000 mg | ORAL_TABLET | Freq: Every day | ORAL | Status: DC
  Administered 2018-05-14 – 2018-05-17 (×4): 200 mg via ORAL
  Filled 2018-05-14 (×4): qty 1

## 2018-05-14 MED ORDER — DULOXETINE HCL 30 MG PO CPEP
30.0000 mg | ORAL_CAPSULE | Freq: Every day | ORAL | Status: DC
  Administered 2018-05-14 – 2018-05-16 (×3): 30 mg via ORAL
  Filled 2018-05-14 (×3): qty 1

## 2018-05-14 MED ORDER — ACETAMINOPHEN 325 MG PO TABS
650.0000 mg | ORAL_TABLET | Freq: Four times a day (QID) | ORAL | Status: DC | PRN
  Administered 2018-05-14 – 2018-05-15 (×2): 650 mg via ORAL
  Filled 2018-05-14 (×2): qty 2

## 2018-05-14 MED ORDER — ALUM & MAG HYDROXIDE-SIMETH 200-200-20 MG/5ML PO SUSP: 30.0000 mL | ORAL | Status: DC | PRN

## 2018-05-14 MED ORDER — MAGNESIUM HYDROXIDE 400 MG/5ML PO SUSP: 30.0000 mL | Freq: Every day | ORAL | Status: DC | PRN

## 2018-05-14 MED ORDER — HYDROXYZINE HCL 25 MG PO TABS: 25.0000 mg | ORAL_TABLET | Freq: Four times a day (QID) | ORAL | Status: AC | PRN

## 2018-05-14 MED ORDER — LOPERAMIDE HCL 2 MG PO CAPS: 2.0000 mg | ORAL_CAPSULE | ORAL | Status: AC | PRN

## 2018-05-14 MED ORDER — LORAZEPAM 1 MG PO TABS
1.0000 mg | ORAL_TABLET | Freq: Two times a day (BID) | ORAL | Status: AC
  Administered 2018-05-16 – 2018-05-17 (×2): 1 mg via ORAL
  Filled 2018-05-14 (×2): qty 1

## 2018-05-14 MED ORDER — LORAZEPAM 1 MG PO TABS
1.0000 mg | ORAL_TABLET | Freq: Every day | ORAL | Status: AC
  Administered 2018-05-18: 1 mg via ORAL
  Filled 2018-05-14: qty 1

## 2018-05-14 MED ORDER — LAMOTRIGINE 25 MG PO TABS
150.0000 mg | ORAL_TABLET | Freq: Every day | ORAL | Status: DC
  Administered 2018-05-14 – 2018-05-17 (×4): 150 mg via ORAL
  Filled 2018-05-14 (×5): qty 1

## 2018-05-14 NOTE — Progress Notes (Signed)
59 year old female admitted to unit. Denies SI/HI, AVH. Patient reports that current her stressors are are drinking, not having a PCP or mental health provider that she's been trying to obtain for the past two years and when she finally got connected to a PCP last month, they didn't take her plea for help seriously (per patient)  because "They never referred me to a psychiatrist." "Therefore I got more depressed and drank a lot more neglecting my sick mother-in-law."  Patient with sad affect very tearful during her assessment, states, "I just hate that I can't help my mother-in-law because I'm always drinking. I can't even help take care of her."  Oriented patient to room and unit. Skin and contraband search completed and witnessed by Aurther Loft, Charity fundraiser. Four small red bumps observed on L) wrist. States, "It doesn't really itch." L) hand also swollen and bruised from the fall prior to admission. Per x-ray results, no fractures noted, skin otherwise warm, dry and intact. No contraband found on patient nor her belongings. Admission assessment completed, fluid and nutrition offered. Patient remains safe on the unit with q 15 minute checks.

## 2018-05-14 NOTE — ED Notes (Signed)
Pt given breakfast tray. Hand hygiene encouraged.  

## 2018-05-14 NOTE — Tx Team (Signed)
Initial Treatment Plan 05/14/2018 6:24 PM Dekiya Obeirne IDH:[id number]    PATIENT STRESSORS: Financial difficulties Marital or family conflict Substance abuse   PATIENT STRENGTHS: Ability for insight Active sense of humor Average or above average intelligence Religious Affiliation Supportive family/friends   PATIENT IDENTIFIED PROBLEMS: ETOH Abuse  Depression  Anxiety                 DISCHARGE CRITERIA:  Improved stabilization in mood, thinking, and/or behavior Withdrawal symptoms are absent or subacute and managed without 24-hour nursing intervention  PRELIMINARY DISCHARGE PLAN: Attend aftercare/continuing care group Attend 12-step recovery group Outpatient therapy Return to previous living arrangement  PATIENT/FAMILY INVOLVEMENT: This treatment plan has been presented to and reviewed with the patient, Laura Haney, and/or family member.  The patient and family have been given the opportunity to ask questions and make suggestions.  Jim Desanctis Faruq Rosenberger, RN 05/14/2018, 6:24 PM

## 2018-05-14 NOTE — ED Notes (Signed)
Linen changed on patient bed. Pt understands she will speak with a psychiatrist today to determine disposition. Calm and cooperative. Maintained on 15 minute checks and observation by security camera for safety.
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
## 2018-05-14 NOTE — ED Provider Notes (Signed)
-----------------------------------------   7:04 AM on 05/14/2018 -----------------------------------------   Blood pressure 134/68, pulse 92, temperature 98 F (36.7 C), temperature source Oral, resp. rate 18, height 1.676 m (5\' 6" ), weight 77.1 kg, SpO2 99 %.  The patient is calm and cooperative at this time.  There have been no acute events since the last update.  Allegedly the patient is to be re-evaluated this morning to determine if appropriate for discharge.   Loleta Rose, MD 05/14/18 [id number]

## 2018-05-14 NOTE — Progress Notes (Signed)
D: Patient's affect is bright. Mood is pleasant. Reports mild pain 4/10 in left hand. Received Tylenol per prn order. Left hand is bruised and swollen. Denies SI, HI and AV hallucinations. Denies any withdrawal symptoms. Doing laundry and interacting appropriately with staff and peers.  A: Continue to monitor and offer support. R: Safety maintained

## 2018-05-14 NOTE — Plan of Care (Signed)
D: Patient's affect is bright. Mood is pleasant. Reports mild pain 4/10 in left hand. Received Tylenol per prn order. Left hand is bruised and swollen. Denies SI, HI and AV hallucinations. Denies any withdrawal symptoms. Doing laundry and interacting appropriately with staff and peers.  A: Continue to monitor and offer support. R: Safety maintained 

## 2018-05-14 NOTE — ED Notes (Signed)
Report given to [place], Charity fundraiser.

## 2018-05-14 NOTE — ED Notes (Signed)
Pt discharged to BMU. Pt has signed voluntary consent form. VS stable. All belongings will be sent with patient.
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
## 2018-05-14 NOTE — ED Notes (Signed)
Pt given lunch tray. Hand hygiene encouraged.   

## 2018-05-14 NOTE — Plan of Care (Signed)
Patient newly admitted, goals have not been met at this time.

## 2018-05-15 MED ORDER — IBUPROFEN 600 MG PO TABS
600.0000 mg | ORAL_TABLET | Freq: Four times a day (QID) | ORAL | Status: DC | PRN
  Administered 2018-05-15 – 2018-05-17 (×5): 600 mg via ORAL
  Filled 2018-05-15 (×5): qty 1

## 2018-05-15 NOTE — Plan of Care (Signed)
D: Patient has a bright affect. Denies SI, HI or symptoms of withdrawal. Swelling appears decreased to left hand compared to yesterday. CIWA 0. Complained on pain 5/10 in left hand and received motrin per prn order. No other complaints. Hygiene is good. Patient is calm and cooperative. A: Continue to monitor for safety and offer support. R: Safety maintained. 
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
## 2018-05-15 NOTE — [facility] Suicide Risk Assessment (Signed)
[facility] Admission Suicide Risk Assessment   Nursing information obtained from:    Demographic factors:  Caucasian, Unemployed Current Mental Status:  NA Loss Factors:  Decrease in vocational status, Decline in physical health, Financial problems / change in socioeconomic status Historical Factors:  Family history of mental illness or substance abuse Risk Reduction Factors:  Living with another person, especially a relative, Positive therapeutic relationship, Religious beliefs about death  Total Time spent with patient: 45 minutes Principal Problem: <principal problem not specified> Diagnosis:  Active Problems:   Bipolar 2 disorder (HCC)  Subjective Data:  Patient was brought to the ER yesaterday after she tripped and hurt her hand at home while intoxicated with alcohol. Her BAL was 237. In the ER she expressed some suicidal thoughts and was making passive death statements, like it would be better if she would not be alive. Psychiatry called for consult. During psych consult, patient expressed depressed mood and some suicidal thoughts, so the admission was recommended for safety and stabilization.  Continued Clinical Symptoms:  Alcohol Use Disorder Identification Test Final Score (AUDIT): 31 The "Alcohol Use Disorders Identification Test", Guidelines for Use in Primary Care, Second Edition.  World Science writer [facility]). Score between 0-7:  no or low risk or alcohol related problems. Score between 8-15:  moderate risk of alcohol related problems. Score between 16-19:  high risk of alcohol related problems. Score 20 or above:  warrants further diagnostic evaluation for alcohol dependence and treatment.   CLINICAL FACTORS:   See H&P   Musculoskeletal:  Psychiatric Specialty Exam:  See H&P                                                               COGNITIVE FEATURES THAT CONTRIBUTE TO RISK:  None    SUICIDE RISK:   Mild:  Suicidal ideation of  limited frequency, intensity, duration, and specificity.  There are no identifiable plans, no associated intent, mild dysphoria and related symptoms, good self-control (both objective and subjective assessment), few other risk factors, and identifiable protective factors, including available and accessible social support.   PSYCH RISK ASSESSMENT: Suicide Risk Assessment: prior suicidal attempts, Caucasian, family history of suicide, h/o trauma/abuse - represent non-modifiable/baseline risk factors. Presence of mental disorder, alcohol/substance use, access to lethal methods/firearms - are dynamic risk factors. The patient denies current suicidal thoughts, she is future-oriented, has responsibilities, help-seeking, has access to mental health care, has family and community support - all protective factors. Therefore, represents a low risk for harming self acutely and elevated chronic risk due to non-modifiable risk factors.    Violence Risk Assessment: substance use, h/o childhood abuse/trauma, major mental illness - are static risk factors predisposing to violence - place her at low to moderate chronic risk of harming others; though she is an acute low risk to others as she is not having any thoughts of wanting to hurt others and recognizes the consequences if he were be physically violent.  [place] Suicide Severity Rating Scale Wish to be dead: No Suicidal thoughts: None currently                 Suicidal thoughts with method: No                 Suicidal intent: No  Suicide intent with specific plan: No                 Suicide behavior: No       PLAN OF CARE: See H&P   I certify that inpatient services furnished can reasonably be expected to improve the patient's condition.   Thalia Party, MD 05/15/2018, 1:48 PM
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
## 2018-05-15 NOTE — [facility] Group Notes (Signed)
LCSW Group Therapy Note 05/15/2018 1:15pm  Type of Therapy and Topic: Group Therapy: Feelings Around Returning Home & Establishing a Supportive Framework and Supporting Oneself When Supports Not Available  Participation Level: Active  Description of Group:  Patients first processed thoughts and feelings about upcoming discharge. These included fears of upcoming changes, lack of change, new living environments, judgements and expectations from others and overall stigma of mental health issues. The group then discussed the definition of a supportive framework, what that looks and feels like, and how do to discern it from an unhealthy non-supportive network. The group identified different types of supports as well as what to do when your family/friends are less than helpful or unavailable  Therapeutic Goals  1. Patient will identify one healthy supportive network that they can use at discharge. 2. Patient will identify one factor of a supportive framework and how to tell it from an unhealthy network. 3. Patient able to identify one coping skill to use when they do not have positive supports from others. 4. Patient will demonstrate ability to communicate their needs through discussion and/or role plays.  Summary of Patient Progress:  The patient stated, "I'd like to be asleep." Pt engaged during group session. As patients processed their anxiety about discharge and described healthy supports patient shared she is not ready to be discharge. She listed her husband as her main support. Patients identified at least one self-care tool they were willing to use after discharge.   Therapeutic Modalities Cognitive Behavioral Therapy Motivational Interviewing   Destyne Goodreau  CUEBAS-COLON, LCSW 05/15/2018 8:16 AM

## 2018-05-15 NOTE — Progress Notes (Signed)
D: Patient has a bright affect. Denies SI, HI or symptoms of withdrawal. Swelling appears decreased to left hand compared to yesterday. CIWA 0. Complained on pain 5/10 in left hand and received motrin per prn order. No other complaints. Hygiene is good. Patient is calm and cooperative. A: Continue to monitor for safety and offer support. R: Safety maintained.
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
## 2018-05-16 DIAGNOSIS — F3181 Bipolar II disorder: Principal | ICD-10-CM

## 2018-05-16 DIAGNOSIS — F1010 Alcohol abuse, uncomplicated: Secondary | ICD-10-CM

## 2018-05-16 MED ORDER — DULOXETINE HCL 30 MG PO CPEP
60.0000 mg | ORAL_CAPSULE | Freq: Every day | ORAL | Status: DC
  Administered 2018-05-17 – 2018-05-18 (×2): 60 mg via ORAL
  Filled 2018-05-16 (×2): qty 2

## 2018-05-16 NOTE — Plan of Care (Signed)
D- Patient alert and oriented. Patient presents in a pleasant mood on assessment stating that she slept "too good" last night and had no major complaints and concerns to voice to this Clinical research associate. Patient rated her pain a "3/10" stating that her hand is swollen. Patient denies any signs/symptoms of depression, however, she rated her depression a "5/10" stating that some of the "basic things" is why she's feeling this way. Patient denies SI, HI, AVH, at this time. Patient's goal for today is "working on normality".  A- Scheduled medications administered to patient, per MD orders. Support and encouragement provided.  Routine safety checks conducted every 15 minutes.  Patient informed to notify staff with problems or concerns.  R- No adverse drug reactions noted. Patient contracts for safety at this time. Patient compliant with medications and treatment plan. Patient receptive, calm, and cooperative. Patient interacts well with others on the unit.  Patient remains safe at this time.   Problem: Education: Goal: Utilization of techniques to improve thought processes will improve Outcome: Progressing Goal: Knowledge of the prescribed therapeutic regimen will improve Outcome: Progressing   Problem: Activity: Goal: Interest or engagement in leisure activities will improve Outcome: Progressing Goal: Imbalance in normal sleep/wake cycle will improve Outcome: Progressing   Problem: Coping: Goal: Coping ability will improve Outcome: Progressing Goal: Will verbalize feelings Outcome: Progressing   Problem: Role Relationship: Goal: Will demonstrate positive changes in social behaviors and relationships Outcome: Progressing   Problem: Safety: Goal: Ability to disclose and discuss suicidal ideas will improve Outcome: Progressing Goal: Ability to identify and utilize support systems that promote safety will improve Outcome: Progressing   Problem: Self-Concept: Goal: Will verbalize positive feelings  about self Outcome: Progressing Goal: Level of anxiety will decrease Outcome: Progressing   Problem: Education: Goal: Understanding of discharge needs will improve Outcome: Progressing   Problem: Health Behavior/Discharge Planning: Goal: Ability to identify changes in lifestyle to reduce recurrence of condition will improve Outcome: Progressing Goal: Identification of resources available to assist in meeting health care needs will improve Outcome: Progressing   Problem: Physical Regulation: Goal: Complications related to the disease process, condition or treatment will be avoided or minimized Outcome: Progressing   Problem: Safety: Goal: Ability to remain free from injury will improve Outcome: Progressing

## 2018-05-16 NOTE — [facility] Suicide Risk Assessment (Signed)
[facility] INPATIENT:  Family/Significant Other Suicide Prevention Education  Suicide Prevention Education:  Contact Attempts: Jalie Varn, husband [phone] has been identified by the patient as the family member/significant other with whom the patient will be residing, and identified as the person(s) who will aid the patient in the event of a mental health crisis.  With written consent from the patient, two attempts were made to provide suicide prevention education, prior to and/or following the patient's discharge.  We were unsuccessful in providing suicide prevention education.  A suicide education pamphlet was given to the patient to share with family/significant other.  Date and time of first attempt: 05/16/2018 at 9:44AM Date and time of second attempt:  CSW was unable to leave a message as the phone just continued to ring and did not go to a voicemail. CSW will attempt again at a later date.  Charlann Lange Sharetta Ricchio MSW LCSW 05/16/2018, 9:44 AM

## 2018-05-16 NOTE — Progress Notes (Signed)
Patient is pleasant upon approach and responding in adequate manner , patient condition seem improved from the time she was admitted, patient states that she is feeling better and her medication is working well for her , patient is medication compliant without any side effect noted, patient mood is bright and affect is good, seeing  patient socializing in the milieu with peers watching television with out any issues. Report sleeping good all night , she denies feeling depressed  and anxious , denies suicidal,  homicidal and express no hallucination and does not express any dilutions, patient is sleeping good only requiring 15 minutes safety checks for safety.

## 2018-05-16 NOTE — Progress Notes (Signed)
Recreation Therapy Notes  INPATIENT RECREATION THERAPY ASSESSMENT  Patient Details Name: Gretell Gelin MRN: [id number] DOB: 28-Jan-1961 Today's Date: 05/16/2018       Information Obtained From: Patient  Able to Participate in Assessment/Interview:    Patient Presentation: Responsive  Reason for Admission (Per Patient): Active Symptoms, Substance Abuse  Patient Stressors:    Coping Skills:   Talk, Substance Abuse, Music  Leisure Interests (2+):  Nature - Vegetable gardening, Nature - Flower gardening(Smoking, playing candy crush)  Frequency of Recreation/Participation:    Awareness of Community Resources:     [employer]:     Current Use:    If no, Barriers?:    Expressed Interest in [employer] Information:    [place]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  Current SI (including self-harm):  No  Current HI:  No  Current AVH: No  Staff Intervention Plan: Group Attendance, Collaborate with Interdisciplinary Treatment Team  Consent to Intern Participation: N/A  Juliocesar Blasius 05/16/2018, 3:57 PM

## 2018-05-16 NOTE — Progress Notes (Signed)
Recreation Therapy Notes  Date: 05/16/2018  Time: 9:30 am  Location: Craft Room  Behavioral response: Appropriate  Intervention Topic: Goals  Discussion/Intervention:  Group content on today was focused on goals. Patients described what goals are and how they define goals. Individuals expressed how they go about setting goals and reaching them. The group identified how important goals are and if they make short term goals to reach long term goals. Patients described how many goals they work on at a time and what affects them not reaching their goal. Individuals described how much time they put into planning and obtaining their goals. The group participated in the intervention "My Goal Board" and made personal goal boards to help them achieve their goal. Clinical Observations/Feedback:  Patient came to group and explained that when she works on a goal, she sometimes finds it hard to get out of bed. She became tearful as she expressed how everyone, she depends on has let her down. Individual was social with peers and staff while participating in group.  Serayah Yazdani LRT/CTRS         Nikita Humble 05/16/2018 11:01 AM

## 2018-05-16 NOTE — [facility] Group Notes (Signed)
LCSW Group Therapy Note   05/16/2018 1:00 PM  Type of Therapy and Topic:  Group Therapy:  Overcoming Obstacles   Participation Level:  Active   Description of Group:    In this group patients will be encouraged to explore what they see as obstacles to their own wellness and recovery. They will be guided to discuss their thoughts, feelings, and behaviors related to these obstacles. The group will process together ways to cope with barriers, with attention given to specific choices patients can make. Each patient will be challenged to identify changes they are motivated to make in order to overcome their obstacles. This group will be process-oriented, with patients participating in exploration of their own experiences as well as giving and receiving support and challenge from other group members.   Therapeutic Goals: 1. Patient will identify personal and current obstacles as they relate to admission. 2. Patient will identify barriers that currently interfere with their wellness or overcoming obstacles.  3. Patient will identify feelings, thought process and behaviors related to these barriers. 4. Patient will identify two changes they are willing to make to overcome these obstacles:      Summary of Patient Progress Patient did attend group and was an active participant in group.  Patient reports that her obstacle is moving to a new area. Patient reports that "anger" is the feeling that she has the most associated with her obstacle.  Patient was supportive of other group members.     Therapeutic Modalities:   Cognitive Behavioral Therapy Solution Focused Therapy Motivational Interviewing Relapse Prevention Therapy  Penni Homans, MSW, LCSW 05/16/2018 12:51 PM

## 2018-05-16 NOTE — Progress Notes (Signed)
[facility] MD Progress Note  05/16/2018 4:49 PM Laura Haney  MRN:  [id number] Subjective: Patient seen chart reviewed.  Patient came into the hospital over the weekend with mood lability agitation and intoxication.  On interview today the patient was neatly dressed and groomed.  Cooperative and pleasant.  She is hyperverbal and tends to be a bit disorganized and tangential in her speech.  Patient admits to chronic heavy alcohol abuse.  Also to bipolar disorder but had recently been at least transiently off of her medicine.  Confirm for me that her medicine most recently was Seroquel, lamotrigine and Cymbalta.  Patient denies any suicidal or homicidal ideation today.  Does not appear to be psychotic. Principal Problem: Bipolar 2 disorder (HCC) Diagnosis: Principal Problem:   Bipolar 2 disorder (HCC) Active Problems:   Alcohol abuse  Total Time spent with patient: 30 minutes  Past Psychiatric History: Patient has a past history of treatment for bipolar disorder.  No history of suicide attempts or violence.  History of alcohol abuse.  Past Medical History:  Past Medical History:  Diagnosis Date  . Bipolar 2 disorder (HCC)   . Depression   . Iron excess   . Left hip pain    History reviewed. No pertinent surgical history. Family History:  Family History  Problem Relation Age of Onset  . Heart disease Mother   . Cancer Brother        pancreatic  . Heart attack Paternal Grandfather    Family Psychiatric  History: See previous Social History:  Social History   Substance and Sexual Activity  Alcohol Use Yes     Social History   Substance and Sexual Activity  Drug Use Not Currently  . Types: Marijuana   Comment: occasionally    Social History   Socioeconomic History  . Marital status: Married    Spouse name: Not on file  . Number of children: Not on file  . Years of education: Not on file  . Highest education level: Not on file  Occupational History  . Not on file  Social Needs   . Financial resource strain: Not on file  . Food insecurity:    Worry: Not on file    Inability: Not on file  . Transportation needs:    Medical: Not on file    Non-medical: Not on file  Tobacco Use  . Smoking status: Current Every Day Smoker    Packs/day: 1.50    Types: Cigarettes  . Smokeless tobacco: Never Used  Substance and Sexual Activity  . Alcohol use: Yes  . Drug use: Not Currently    Types: Marijuana    Comment: occasionally  . Sexual activity: Yes  Lifestyle  . Physical activity:    Days per week: Not on file    Minutes per session: Not on file  . Stress: Not on file  Relationships  . Social connections:    Talks on phone: Not on file    Gets together: Not on file    Attends religious service: Not on file    Active member of club or organization: Not on file    Attends meetings of clubs or organizations: Not on file    Relationship status: Not on file  Other Topics Concern  . Not on file  Social History Narrative  . Not on file   Additional Social History:    History of alcohol / drug use?: Yes Longest period of sobriety (when/how long): 2 years Negative Consequences of Use: Financial  Withdrawal Symptoms: Tremors, Tingling                    Sleep: Fair  Appetite:  Fair  Current Medications: Current Facility-Administered Medications  Medication Dose Route Frequency Provider Last Rate Last Dose  . acetaminophen (TYLENOL) tablet 650 mg  650 mg Oral Q6H PRN Luciano Cutter, DO   650 mg at 05/15/18 1497  . alum & mag hydroxide-simeth (MAALOX/MYLANTA) 200-200-20 MG/5ML suspension 30 mL  30 mL Oral Q4H PRN Luciano Cutter, DO      . [START ON 05/17/2018] DULoxetine (CYMBALTA) DR capsule 60 mg  60 mg Oral Daily Etoy Mcdonnell T, MD      . hydrOXYzine (ATARAX/VISTARIL) tablet 25 mg  25 mg Oral Q6H PRN [name], DO      . ibuprofen (ADVIL,MOTRIN) tablet 600 mg  600 mg Oral Q6H PRN Thalia Party, MD   600 mg at 05/16/18 1510  . lamoTRIgine  (LAMICTAL) tablet 150 mg  150 mg Oral QHS [name], DO   150 mg at 05/15/18 2125  . loperamide (IMODIUM) capsule 2-4 mg  2-4 mg Oral PRN Luciano Cutter, DO      . LORazepam (ATIVAN) tablet 1 mg  1 mg Oral Q6H PRN Luciano Cutter, DO      . LORazepam (ATIVAN) tablet 1 mg  1 mg Oral BID [name], DO       Followed by  . [START ON 05/18/2018] LORazepam (ATIVAN) tablet 1 mg  1 mg Oral Daily [name], DO      . magnesium hydroxide (MILK OF MAGNESIA) suspension 30 mL  30 mL Oral Daily PRN Luciano Cutter, DO      . multivitamin with minerals tablet 1 tablet  1 tablet Oral Daily [name], DO   1 tablet at 05/16/18 0808  . ondansetron (ZOFRAN-ODT) disintegrating tablet 4 mg  4 mg Oral Q6H PRN Luciano Cutter, DO      . QUEtiapine (SEROQUEL) tablet 200 mg  200 mg Oral QHS [name], DO   200 mg at 05/15/18 2124  . thiamine (VITAMIN B-1) tablet 100 mg  100 mg Oral Daily [name], DO   100 mg at 05/16/18 0263    Lab Results: No results found for this or any previous visit (from the past 48 hour(s)).  Blood Alcohol level:  Lab Results  Component Value Date   ETH 237 (H) 05/13/2018    Metabolic Disorder Labs: Lab Results  Component Value Date   HGBA1C 4.6 01/24/2018   No results found for: PROLACTIN Lab Results  Component Value Date   CHOL 210 (H) 01/24/2018   TRIG 145 01/24/2018   HDL 61 01/24/2018   LDLCALC 120 (H) 01/24/2018    Physical Findings: AIMS: Facial and Oral Movements Muscles of Facial Expression: None, normal Lips and Perioral Area: None, normal Jaw: None, normal Tongue: None, normal,Extremity Movements Upper (arms, wrists, hands, fingers): None, normal Lower (legs, knees, ankles, toes): None, normal, Trunk Movements Neck, shoulders, hips: None, normal, Overall Severity Severity of abnormal movements (highest score from questions above): None, normal Incapacitation due to abnormal movements: None, normal Patient's awareness of  abnormal movements (rate only patient's report): No Awareness, Dental Status Current problems with teeth and/or dentures?: No Does patient usually wear dentures?: No  CIWA:  CIWA-Ar Total: 0 COWS:     Musculoskeletal: Strength & Muscle Tone: within normal limits Gait & Station: normal  Patient leans: N/A  Psychiatric Specialty Exam: Physical Exam  Nursing note and vitals reviewed. Constitutional: She appears well-developed and well-nourished.  HENT:  Head: Normocephalic and atraumatic.  Eyes: Pupils are equal, round, and reactive to light. Conjunctivae are normal.  Neck: Normal range of motion.  Cardiovascular: Regular rhythm and normal heart sounds.  Respiratory: Effort normal. No respiratory distress.  GI: Soft.  Musculoskeletal: Normal range of motion.  Neurological: She is alert.  Skin: Skin is warm and dry.  Psychiatric: She has a normal mood and affect. Her behavior is normal. Thought content normal. Her speech is rapid and/or pressured. She expresses impulsivity. She exhibits abnormal recent memory.    Review of Systems  Constitutional: Negative.   HENT: Negative.   Eyes: Negative.   Respiratory: Negative.   Cardiovascular: Negative.   Gastrointestinal: Negative.   Musculoskeletal: Negative.   Skin: Negative.   Neurological: Negative.   Psychiatric/Behavioral: Positive for substance abuse. Negative for depression, hallucinations, memory loss and suicidal ideas. The patient is nervous/anxious. The patient does not have insomnia.     Blood pressure 118/65, pulse 87, temperature 97.7 F (36.5 C), temperature source Oral, resp. rate 18, height 5\' 8"  (1.727 m), weight 75.3 kg, SpO2 99 %.Body mass index is 25.24 kg/m.  General Appearance: Fairly Groomed  Eye Contact:  Fair  Speech:  Pressured  Volume:  Increased  Mood:  Euthymic  Affect:  Full Range  Thought Process:  Disorganized  Orientation:  Full (Time, Place, and Person)  Thought Content:  Logical  Suicidal  Thoughts:  No  Homicidal Thoughts:  No  Memory:  Immediate;   Fair Recent;   Fair Remote;   Fair  Judgement:  Fair  Insight:  Fair  Psychomotor Activity:  Normal  Concentration:  Concentration: Fair  Recall:  [employer] of Knowledge:  Fair  Language:  Fair  Akathisia:  No  Handed:  Right  AIMS (if indicated):     Assets:  Desire for Improvement Housing Social Support  ADL's:  Intact  Cognition:  WNL  Sleep:  Number of Hours: 7.25     Treatment Plan Summary: Daily contact with patient to assess and evaluate symptoms and progress in treatment, Medication management and Plan Patient seen chart reviewed.  Restarted medicine at doses consistent with what she was taking previously including increasing the Seroquel up to 200 mg at night.  Patient is engaged in individual and group therapy.  We need to make sure that we can arrange for appropriate discharge planning and outpatient follow-up.  No other change to medicine today.  Mordecai Rasmussen, MD 05/16/2018, 4:49 PM

## 2018-05-16 NOTE — Tx Team (Addendum)
Interdisciplinary Treatment and Diagnostic Plan Update  05/16/2018 Time of Session: 230PM [name] MRN: [id number]  Principal Diagnosis: <principal problem not specified>  Secondary Diagnoses: Active Problems:   Bipolar 2 disorder (HCC)   Current Medications:  Current Facility-Administered Medications  Medication Dose Route Frequency Provider Last Rate Last Dose  . acetaminophen (TYLENOL) tablet 650 mg  650 mg Oral Q6H PRN Luciano Cutter, DO   650 mg at 05/15/18 0488  . alum & mag hydroxide-simeth (MAALOX/MYLANTA) 200-200-20 MG/5ML suspension 30 mL  30 mL Oral Q4H PRN Luciano Cutter, DO      . [START ON 05/17/2018] DULoxetine (CYMBALTA) DR capsule 60 mg  60 mg Oral Daily [name], [name], MD      . hydrOXYzine (ATARAX/VISTARIL) tablet 25 mg  25 mg Oral Q6H PRN [name], DO      . ibuprofen (ADVIL,MOTRIN) tablet 600 mg  600 mg Oral Q6H PRN Thalia Party, MD   600 mg at 05/16/18 1510  . lamoTRIgine (LAMICTAL) tablet 150 mg  150 mg Oral QHS [name], DO   150 mg at 05/15/18 2125  . loperamide (IMODIUM) capsule 2-4 mg  2-4 mg Oral PRN Luciano Cutter, DO      . LORazepam (ATIVAN) tablet 1 mg  1 mg Oral Q6H PRN Luciano Cutter, DO      . LORazepam (ATIVAN) tablet 1 mg  1 mg Oral BID [name], DO       Followed by  . [START ON 05/18/2018] LORazepam (ATIVAN) tablet 1 mg  1 mg Oral Daily [name], DO      . magnesium hydroxide (MILK OF MAGNESIA) suspension 30 mL  30 mL Oral Daily PRN Luciano Cutter, DO      . multivitamin with minerals tablet 1 tablet  1 tablet Oral Daily [name], DO   1 tablet at 05/16/18 0808  . ondansetron (ZOFRAN-ODT) disintegrating tablet 4 mg  4 mg Oral Q6H PRN Luciano Cutter, DO      . QUEtiapine (SEROQUEL) tablet 200 mg  200 mg Oral QHS [name], DO   200 mg at 05/15/18 2124  . thiamine (VITAMIN B-1) tablet 100 mg  100 mg Oral Daily [name], DO   100 mg at 05/16/18 8916   PTA Medications: Medications Prior to  Admission  Medication Sig Dispense Refill Last Dose  . ciprofloxacin-dexamethasone (CIPRODEX) OTIC suspension Place 4 drops into the right ear 2 (two) times daily. 7.5 mL 0   . DULoxetine (CYMBALTA) 60 MG capsule Take 60 mg by mouth daily.   0 Taking  . lamoTRIgine (LAMICTAL) 150 MG tablet Take 150 mg by mouth at bedtime.   0 Taking  . QUEtiapine (SEROQUEL) 100 MG tablet Take 200 mg by mouth at bedtime.   0 Taking    Patient Stressors: Financial difficulties Marital or family conflict Substance abuse  Patient Strengths: Ability for insight Active sense of humor Average or above average intelligence Religious Affiliation Supportive family/friends  Treatment Modalities: Medication Management, Group therapy, Case management,  1 to 1 session with clinician, Psychoeducation, Recreational therapy.   Physician Treatment Plan for Primary Diagnosis: <principal problem not specified> Long Term Goal(s): Improvement in symptoms so as ready for discharge Improvement in symptoms so as ready for discharge   Short Term Goals: Ability to identify changes in lifestyle to reduce recurrence of condition will improve Ability to verbalize feelings will improve Ability to disclose and discuss suicidal  ideas Ability to demonstrate self-control will improve Ability to identify and develop effective coping behaviors will improve Ability to identify triggers associated with substance abuse/mental health issues will improve Ability to identify changes in lifestyle to reduce recurrence of condition will improve Ability to verbalize feelings will improve Ability to disclose and discuss suicidal ideas Ability to identify and develop effective coping behaviors will improve Ability to identify triggers associated with substance abuse/mental health issues will improve  Medication Management: Evaluate patient's response, side effects, and tolerance of medication regimen.  Therapeutic Interventions: 1 to 1  sessions, Unit Group sessions and Medication administration.  Evaluation of Outcomes: Progressing  Physician Treatment Plan for Secondary Diagnosis: Active Problems:   Bipolar 2 disorder (HCC)  Long Term Goal(s): Improvement in symptoms so as ready for discharge Improvement in symptoms so as ready for discharge   Short Term Goals: Ability to identify changes in lifestyle to reduce recurrence of condition will improve Ability to verbalize feelings will improve Ability to disclose and discuss suicidal ideas Ability to demonstrate self-control will improve Ability to identify and develop effective coping behaviors will improve Ability to identify triggers associated with substance abuse/mental health issues will improve Ability to identify changes in lifestyle to reduce recurrence of condition will improve Ability to verbalize feelings will improve Ability to disclose and discuss suicidal ideas Ability to identify and develop effective coping behaviors will improve Ability to identify triggers associated with substance abuse/mental health issues will improve     Medication Management: Evaluate patient's response, side effects, and tolerance of medication regimen.  Therapeutic Interventions: 1 to 1 sessions, Unit Group sessions and Medication administration.  Evaluation of Outcomes: Progressing   RN Treatment Plan for Primary Diagnosis: <principal problem not specified> Long Term Goal(s): Knowledge of disease and therapeutic regimen to maintain health will improve  Short Term Goals: Ability to demonstrate self-control, Ability to identify and develop effective coping behaviors will improve and Compliance with prescribed medications will improve  Medication Management: RN will administer medications as ordered by provider, will assess and evaluate patient's response and provide education to patient for prescribed medication. RN will report any adverse and/or side effects to prescribing  provider.  Therapeutic Interventions: 1 on 1 counseling sessions, Psychoeducation, Medication administration, Evaluate responses to treatment, Monitor vital signs and CBGs as ordered, Perform/monitor CIWA, COWS, AIMS and Fall Risk screenings as ordered, Perform wound care treatments as ordered.  Evaluation of Outcomes: Progressing   LCSW Treatment Plan for Primary Diagnosis: <principal problem not specified> Long Term Goal(s): Safe transition to appropriate next level of care at discharge, Engage patient in therapeutic group addressing interpersonal concerns.  Short Term Goals: Engage patient in aftercare planning with referrals and resources, Increase emotional regulation, Facilitate acceptance of mental health diagnosis and concerns, Identify triggers associated with mental health/substance abuse issues and Increase skills for wellness and recovery  Therapeutic Interventions: Assess for all discharge needs, 1 to 1 time with Social worker, Explore available resources and support systems, Assess for adequacy in community support network, Educate family and significant other(s) on suicide prevention, Complete Psychosocial Assessment, Interpersonal group therapy.  Evaluation of Outcomes: Progressing   Progress in Treatment: Attending groups: Yes. Participating in groups: Yes. Taking medication as prescribed: Yes. Toleration medication: Yes. Family/Significant other contact made: No, will contact:  Pts husband Patient understands diagnosis: Yes. Discussing patient identified problems/goals with staff: Yes. Medical problems stabilized or resolved: Yes. Denies suicidal/homicidal ideation: Yes. Issues/concerns per patient self-inventory: No. Other: n/a  New problem(s) identified: No, Describe:  none  New Short Term/Long Term Goal(s): medication management for mood stabilization; development of comprehensive mental wellness/sobriety plan.   Patient Goals:  "Find a primary care physician  so I can get a psychiatrist in the area"  Discharge Plan or Barriers: SPE pamphlet, Mobile Crisis information, and AA/NA information provided to patient for additional community support and resources. Pt has an appointment scheduled with [facility] and CBC 05/25/2018. Pt will decide which facility she wants to pursue for mental health treatment.  Reason for Continuation of Hospitalization: Medication stabilization  Estimated Length of Stay: 5-7 days  Recreational Therapy: Patient Stressors: N/A Patient Goal: Patient will identify 3 positive replacements for unhealthy/harmful habits within 5 recreation therapy group sessions  Attendees: Patient: Laura Haney 05/16/2018 3:21 PM  Physician: Dr Toni Amend MD 05/16/2018 3:21 PM  Nursing: Doyce Para RN 05/16/2018 3:21 PM  RN Care Manager: 05/16/2018 3:21 PM  Social Worker: Olivia Moton LCSW 05/16/2018 3:21 PM  Recreational Therapist: Garret Reddish CTRS LRT 05/16/2018 3:21 PM  Other: Lowella Dandy LCSW 05/16/2018 3:21 PM  Other: Penni Homans LCSW 05/16/2018 3:21 PM  Other: 05/16/2018 3:21 PM    Scribe for Treatment Team: Charlann Lange Moton, LCSW 05/16/2018 3:21 PM

## 2018-05-17 MED ORDER — DULOXETINE HCL 60 MG PO CPEP: 60.0000 mg | ORAL_CAPSULE | Freq: Every day | ORAL | 1 refills | Status: AC

## 2018-05-17 MED ORDER — LAMOTRIGINE 150 MG PO TABS: 150.0000 mg | ORAL_TABLET | Freq: Every day | ORAL | 1 refills | Status: DC

## 2018-05-17 MED ORDER — QUETIAPINE FUMARATE 200 MG PO TABS: 200.0000 mg | ORAL_TABLET | Freq: Every day | ORAL | 1 refills | Status: AC

## 2018-05-17 NOTE — Progress Notes (Signed)
Recreation Therapy Notes    Date: 05/17/2018  Time: 9:30 am  Location: Craft Room  Behavioral response: Appropriate  Intervention Topic: Values  Discussion/Intervention:  Group content today was focused on values. The group identified what values are and where they come from. Individuals expressed some values and how many they have. Patients described how they go about add or removing values. The group described the importance of having values and how they go about using them in daily life. Patient participated in the intervention "My Values" where they were able to pick out values that were important to them and make a visual aide.  Clinical Observations/Feedback:  Patient came to group and identified her values as respect, truth and integrity. She explained that her values come from her culture. Individual was social with peers and staff while participating in group.  Laura Haney LRT/CTRS           Milam Allbaugh 05/17/2018 11:09 AM

## 2018-05-17 NOTE — BHH Suicide Risk Assessment (Signed)
[facility] Discharge Suicide Risk Assessment   Principal Problem: Bipolar 2 disorder [facility]) Discharge Diagnoses: Principal Problem:   Bipolar 2 disorder (HCC) Active Problems:   Alcohol abuse   Total Time spent with patient: 30 minutes  Musculoskeletal: Strength & Muscle Tone: within normal limits Gait & Station: normal Patient leans: N/A  Psychiatric Specialty Exam: Review of Systems  Constitutional: Negative.   HENT: Negative.   Eyes: Negative.   Respiratory: Negative.   Cardiovascular: Negative.   Gastrointestinal: Negative.   Musculoskeletal: Negative.   Skin: Negative.   Neurological: Negative.   Psychiatric/Behavioral: Negative.     Blood pressure 131/67, pulse 85, temperature 97.6 F (36.4 C), temperature source Oral, resp. rate 18, height 5\' 8"  (1.727 m), weight 75.3 kg, SpO2 100 %.Body mass index is 25.24 kg/m.  General Appearance: Fairly Groomed  Patent attorney::  Good  Speech:  Normal Rate409  Volume:  Normal  Mood:  Euthymic  Affect:  Congruent  Thought Process:  Goal Directed  Orientation:  Full (Time, Place, and Person)  Thought Content:  Logical  Suicidal Thoughts:  No  Homicidal Thoughts:  No  Memory:  Immediate;   Fair Recent;   Fair Remote;   Fair  Judgement:  Fair  Insight:  Fair  Psychomotor Activity:  Normal  Concentration:  Fair  Recall:  [employer] of Knowledge:Fair  Language: Fair  Akathisia:  No  Handed:  Right  AIMS (if indicated):     Assets:  Desire for Improvement Housing Resilience  Sleep:  Number of Hours: 8.5  Cognition: WNL  ADL's:  Intact   Mental Status Per Nursing Assessment::   On Admission:  NA  Demographic Factors:  Caucasian  Loss Factors: Financial problems/change in socioeconomic status  Historical Factors: Impulsivity  Risk Reduction Factors:   Sense of responsibility to family, Living with another person, especially a relative, Positive social support and Positive therapeutic relationship  Continued  Clinical Symptoms:  Depression:   Impulsivity Alcohol/Substance Abuse/Dependencies  Cognitive Features That Contribute To Risk:  Loss of executive function    Suicide Risk:  Minimal: No identifiable suicidal ideation.  Patients presenting with no risk factors but with morbid ruminations; may be classified as minimal risk based on the severity of the depressive symptoms  Follow-up Information    [facility] Follow up on 05/25/2018.   Why:  You have an appointment with [facility] scheduled for Wednesday 05/25/18 at 2:45PM. Telepsych is available if you do not feel comfortable going to the office, you will just have to call and let them know your preference. Thank You! Contact information: [address] [place], [place] [address] Phone: [phone] Fax: [phone]       [facility] Follow up on 05/25/2018.   Why:  You have a med management appointment scheduled for Wednesday 05/25/18 at 1:00 with Theresa Mulligan.. Please bring photo id and insurance card. If you need to cancel you must give a 24 hour notice. Thank You! Contact information: [address] [place] [address] [phone]           Plan Of Care/Follow-up recommendations:  Activity:  Activity as tolerated Diet:  Regular diet Other:  Follow-up with outpatient mental health prescriptions have been provided  Mordecai Rasmussen, MD 05/17/2018, 4:44 PM

## 2018-05-17 NOTE — BHH Group Notes (Signed)
Feelings Around Diagnosis 05/17/2018 1PM  Type of Therapy/Topic:  Group Therapy:  Feelings about Diagnosis  Participation Level:  Active   Description of Group:   This group will allow patients to explore their thoughts and feelings about diagnoses they have received. Patients will be guided to explore their level of understanding and acceptance of these diagnoses. Facilitator will encourage patients to process their thoughts and feelings about the reactions of others to their diagnosis and will guide patients in identifying ways to discuss their diagnosis with significant others in their lives. This group will be process-oriented, with patients participating in exploration of their own experiences, giving and receiving support, and processing challenge from other group members.   Therapeutic Goals: 1. Patient will demonstrate understanding of diagnosis as evidenced by identifying two or more symptoms of the disorder 2. Patient will be able to express two feelings regarding the diagnosis 3. Patient will demonstrate their ability to communicate their needs through discussion and/or role play  Summary of Patient Progress:  Actively and appropriately engaged in the group. Patient was able to provide support and validation to other group members.Patient practiced active listening when interacting with the facilitator and other group members. Patient discussed discord with her son and his reaction to her mood and behavior changes. Patient also discussed being diagnosed with bipolar disorder and the changes that occur when she is in a manic phase.     Therapeutic Modalities:   Cognitive Behavioral Therapy Brief Therapy Feelings Identification    Suzan Slick, LCSW 05/17/2018 2:27 PM

## 2018-05-17 NOTE — Progress Notes (Signed)
[facility] MD Progress Note  05/17/2018 4:41 PM Laura Haney  MRN:  [id number] Subjective: Patient seen chart reviewed.  Patient reports she is feeling better today.  Mood feels calm and stable.  Does not feel as nervous or worried.  Slept okay last night.  Patient in interview remains neatly groomed very appropriate in her interaction.  Not as tangential in her thinking as she appeared to be at times yesterday.  Showing good insight.  No acute sign of dangerousness. Principal Problem: Bipolar 2 disorder (HCC) Diagnosis: Principal Problem:   Bipolar 2 disorder (HCC) Active Problems:   Alcohol abuse  Total Time spent with patient: 30 minutes  Past Psychiatric History: Past history of bipolar 2 and alcohol abuse  Past Medical History:  Past Medical History:  Diagnosis Date  . Bipolar 2 disorder (HCC)   . Depression   . Iron excess   . Left hip pain    History reviewed. No pertinent surgical history. Family History:  Family History  Problem Relation Age of Onset  . Heart disease Mother   . Cancer Brother        pancreatic  . Heart attack Paternal Grandfather    Family Psychiatric  History: None reported Social History:  Social History   Substance and Sexual Activity  Alcohol Use Yes     Social History   Substance and Sexual Activity  Drug Use Not Currently  . Types: Marijuana   Comment: occasionally    Social History   Socioeconomic History  . Marital status: Married    Spouse name: Not on file  . Number of children: Not on file  . Years of education: Not on file  . Highest education level: Not on file  Occupational History  . Not on file  Social Needs  . Financial resource strain: Not on file  . Food insecurity:    Worry: Not on file    Inability: Not on file  . Transportation needs:    Medical: Not on file    Non-medical: Not on file  Tobacco Use  . Smoking status: Current Every Day Smoker    Packs/day: 1.50    Types: Cigarettes  . Smokeless tobacco: Never  Used  Substance and Sexual Activity  . Alcohol use: Yes  . Drug use: Not Currently    Types: Marijuana    Comment: occasionally  . Sexual activity: Yes  Lifestyle  . Physical activity:    Days per week: Not on file    Minutes per session: Not on file  . Stress: Not on file  Relationships  . Social connections:    Talks on phone: Not on file    Gets together: Not on file    Attends religious service: Not on file    Active member of club or organization: Not on file    Attends meetings of clubs or organizations: Not on file    Relationship status: Not on file  Other Topics Concern  . Not on file  Social History Narrative  . Not on file   Additional Social History:    History of alcohol / drug use?: Yes Longest period of sobriety (when/how long): 2 years Negative Consequences of Use: Financial Withdrawal Symptoms: Tremors, Tingling                    Sleep: Fair  Appetite:  Fair  Current Medications: Current Facility-Administered Medications  Medication Dose Route Frequency Provider Last Rate Last Dose  . acetaminophen (TYLENOL) tablet 650  mg  650 mg Oral Q6H PRN Luciano Cutter, DO   650 mg at 05/15/18 1610  . alum & mag hydroxide-simeth (MAALOX/MYLANTA) 200-200-20 MG/5ML suspension 30 mL  30 mL Oral Q4H PRN Luciano Cutter, DO      . DULoxetine (CYMBALTA) DR capsule 60 mg  60 mg Oral Daily ,  T, MD   60 mg at 05/17/18 0731  . hydrOXYzine (ATARAX/VISTARIL) tablet 25 mg  25 mg Oral Q6H PRN [name], DO      . ibuprofen (ADVIL,MOTRIN) tablet 600 mg  600 mg Oral Q6H PRN Thalia Party, MD   600 mg at 05/17/18 0735  . lamoTRIgine (LAMICTAL) tablet 150 mg  150 mg Oral QHS [name], DO   150 mg at 05/16/18 2144  . loperamide (IMODIUM) capsule 2-4 mg  2-4 mg Oral PRN Luciano Cutter, DO      . LORazepam (ATIVAN) tablet 1 mg  1 mg Oral Q6H PRN Luciano Cutter, DO      . [START ON 05/18/2018] LORazepam (ATIVAN) tablet 1 mg  1 mg Oral Daily [name], DO      . magnesium hydroxide (MILK OF MAGNESIA) suspension 30 mL  30 mL Oral Daily PRN Luciano Cutter, DO      . multivitamin with minerals tablet 1 tablet  1 tablet Oral Daily [name], DO   1 tablet at 05/17/18 0731  . ondansetron (ZOFRAN-ODT) disintegrating tablet 4 mg  4 mg Oral Q6H PRN Luciano Cutter, DO      . QUEtiapine (SEROQUEL) tablet 200 mg  200 mg Oral QHS [name], DO   200 mg at 05/16/18 2144  . thiamine (VITAMIN B-1) tablet 100 mg  100 mg Oral Daily [name], DO   100 mg at 05/17/18 9604    Lab Results: No results found for this or any previous visit (from the past 48 hour(s)).  Blood Alcohol level:  Lab Results  Component Value Date   ETH 237 (H) 05/13/2018    Metabolic Disorder Labs: Lab Results  Component Value Date   HGBA1C 4.6 01/24/2018   No results found for: PROLACTIN Lab Results  Component Value Date   CHOL 210 (H) 01/24/2018   TRIG 145 01/24/2018   HDL 61 01/24/2018   LDLCALC 120 (H) 01/24/2018    Physical Findings: AIMS: Facial and Oral Movements Muscles of Facial Expression: None, normal Lips and Perioral Area: None, normal Jaw: None, normal Tongue: None, normal,Extremity Movements Upper (arms, wrists, hands, fingers): None, normal Lower (legs, knees, ankles, toes): None, normal, Trunk Movements Neck, shoulders, hips: None, normal, Overall Severity Severity of abnormal movements (highest score from questions above): None, normal Incapacitation due to abnormal movements: None, normal Patient's awareness of abnormal movements (rate only patient's report): No Awareness, Dental Status Current problems with teeth and/or dentures?: No Does patient usually wear dentures?: No  CIWA:  CIWA-Ar Total: 0 COWS:     Musculoskeletal: Strength & Muscle Tone: within normal limits Gait & Station: normal Patient leans: N/A  Psychiatric Specialty Exam: Physical Exam  Nursing note and vitals reviewed. Constitutional: She  appears well-developed and well-nourished.  HENT:  Head: Normocephalic and atraumatic.  Eyes: Pupils are equal, round, and reactive to light. Conjunctivae are normal.  Neck: Normal range of motion.  Cardiovascular: Regular rhythm and normal heart sounds.  Respiratory: Effort normal.  GI: Soft.  Musculoskeletal: Normal range of motion.  Neurological: She is alert.  Skin:  Skin is warm and dry.  Psychiatric: She has a normal mood and affect. Her speech is normal and behavior is normal. Judgment and thought content normal. Cognition and memory are normal.    Review of Systems  Constitutional: Negative.   HENT: Negative.   Eyes: Negative.   Respiratory: Negative.   Cardiovascular: Negative.   Gastrointestinal: Negative.   Musculoskeletal: Negative.   Skin: Negative.   Neurological: Negative.   Psychiatric/Behavioral: Negative.     Blood pressure 131/67, pulse 85, temperature 97.6 F (36.4 C), temperature source Oral, resp. rate 18, height 5\' 8"  (1.727 m), weight 75.3 kg, SpO2 100 %.Body mass index is 25.24 kg/m.  General Appearance: Fairly Groomed  Eye Contact:  Good  Speech:  Clear and Coherent  Volume:  Normal  Mood:  Euthymic  Affect:  Appropriate  Thought Process:  Goal Directed  Orientation:  Full (Time, Place, and Person)  Thought Content:  Logical  Suicidal Thoughts:  No  Homicidal Thoughts:  No  Memory:  Immediate;   Fair Recent;   Fair Remote;   Fair  Judgement:  Fair  Insight:  Fair  Psychomotor Activity:  Normal  Concentration:  Concentration: Fair  Recall:  [employer] of Knowledge:  Fair  Language:  Fair  Akathisia:  No  Handed:  Right  AIMS (if indicated):     Assets:  Desire for Improvement Housing Physical Health Resilience Social Support  ADL's:  Intact  Cognition:  WNL  Sleep:  Number of Hours: 8.5     Treatment Plan Summary: Daily contact with patient to assess and evaluate symptoms and progress in treatment, Medication management and Plan  Review treatment plan with the patient including both for alcohol and mood symptoms.  Reviewed medication.  Patient appears to be much [place] today and we can plan for discharge tomorrow.  I will work on getting prescriptions ready so that we can have her discharge by tomorrow.  Staff will provide recommendations for outpatient follow-up.  Supportive therapy and psychoeducation completed.  Mordecai Rasmussen, MD 05/17/2018, 4:41 PM

## 2018-05-17 NOTE — [facility] Suicide Risk Assessment (Signed)
[facility] INPATIENT:  Family/Significant Other Suicide Prevention Education  Suicide Prevention Education:  Education Completed; Zanya Bonura, husband [phone] has been identified by the patient as the family member/significant other with whom the patient will be residing, and identified as the person(s) who will aid the patient in the event of a mental health crisis (suicidal ideations/suicide attempt).  With written consent from the patient, the family member/significant other has been provided the following suicide prevention education, prior to the and/or following the discharge of the patient.  The suicide prevention education provided includes the following:  Suicide risk factors  Suicide prevention and interventions  National Suicide Hotline telephone number  [facility] assessment telephone number  [facility] Emergency Assistance 911  [facility] and/or Residential Mobile Crisis Unit telephone number  Request made of family/significant other to:  Remove weapons (e.g., guns, rifles, knives), all items previously/currently identified as safety concern.    Remove drugs/medications (over-the-counter, prescriptions, illicit drugs), all items previously/currently identified as a safety concern.  The family member/significant other verbalizes understanding of the suicide prevention education information provided.  The family member/significant other agrees to remove the items of safety concern listed above.  Ethelene Browns reported that pt ended up in the hospital due to her depression. Husband denied any concerns with pt returning home at discharge but expressed some SI concerns due to pt having past suicide attempts. Husband stating understanding in calling 911 or crisis hotline if he has concerns once pt is back home. Husband reported guns and weapons being in the home and reported that they are unloaded and secured. Husband asked questions about pts medication and how she  is doing. CSW informed husband that medications are out the scope of her practice.  Charlann Lange Wali Reinheimer MSW LCSW 05/17/2018, 10:29 AM

## 2018-05-17 NOTE — Plan of Care (Signed)
Pt. Coping is reportedly improved. Pt. Denies si/hi/avh, able to contract for safety. Pt. Has been able to remain safe, monitored by staff as well. Pt. Endorses an improved mood.   Problem: Coping: Goal: Coping ability will improve Outcome: Progressing Goal: Will verbalize feelings Outcome: Progressing   Problem: Safety: Goal: Ability to disclose and discuss suicidal ideas will improve Outcome: Progressing   Problem: Safety: Goal: Ability to remain free from injury will improve Outcome: Progressing

## 2018-05-17 NOTE — Progress Notes (Signed)
D: Pt during assessments denies SI/HI/AVH, able to contract for safety. Pt is pleasant and cooperative, engages good. Pt. Endorses a mostly normal mood, but expresses some anxiety and depression still present. Pt. Reports anxiety is low and depression is 4/10. Because of uncertainties related to discharge.  A: Q x 15 minute observation checks to be completed for safety. Patient was provided with education.  Patient was given/offered medications per orders. Patient  was encourage to attend groups, participate in unit activities and continue with plan of care. Pt. Chart and plans of care reviewed. Pt. Given support and encouragement.   R: Patient is complaint with medications and unit procedures. Pt. Eating good, eats good breakfast this morning. Pt. And this writer spoke about coming up with goals to be successful, but patient reports that it is difficult to come up with goals.

## 2018-05-18 NOTE — Progress Notes (Signed)
Recreation Therapy Notes  INPATIENT RECREATION TR PLAN  Patient Details Name: Laura Haney MRN: [id number] DOB: [birth year] Today's Date: 05/18/2018  Rec Therapy Plan Is patient appropriate for Therapeutic Recreation?: Yes Treatment times per week: at least 3 Estimated Length of Stay: 5-7 days TR Treatment/Interventions: Group participation (Comment)  Discharge Criteria Pt will be discharged from therapy if:: Discharged Treatment plan/goals/alternatives discussed and agreed upon by:: Patient/family  Discharge Summary Short term goals set: Patient will identify 3 positive replacements for unhealthy/harmful habits within 5 recreation therapy group sessions Short term goals met: Complete Progress toward goals comments: Groups attended Which groups?: Goal setting, Other (Comment)(Values) Reason goals not met: N/A Therapeutic equipment acquired: N/A Reason patient discharged from therapy: Discharge from hospital Pt/family agrees with progress & goals achieved: Yes Date patient discharged from therapy: 05/18/18   Haldon Carley 05/18/2018, 1:46 PM

## 2018-05-18 NOTE — Plan of Care (Signed)
Patient stated to this writer that she is feeling better and is ready to go home. Patient was compliant with medication administration per MD order and procedures on the unit.   Problem: Education: Goal: Knowledge of the prescribed therapeutic regimen will improve Outcome: Progressing   Problem: Self-Concept: Goal: Level of anxiety will decrease Outcome: Progressing

## 2018-05-18 NOTE — Progress Notes (Signed)
D - Patient was in her room upon arrival to the unit. Patient was pleasant during assessment and medication administration. Patient denies SI/HI/AVH, pain, anxiety and depression with this Clinical research associate. Patient stated, "I am ready to go home tomorrow. I feel so much better now than I did when I first got here."   A - Patient was compliant with medication administration per MD orders. Patient was given education. Patient given support and encouragement to be active in her treatment plan. Patient informed to let staff know if there are any issues or problems on the unit.   R - Patient being monitored Q 15 minutes for safety per unit protocol. Patient remains safe on the unit.

## 2018-05-18 NOTE — Progress Notes (Signed)
  [facility] Adult Case Management Discharge Plan :  Will you be returning to the same living situation after discharge:  Yes,  home with husband At discharge, do you have transportation home?: Yes,  husband Do you have the ability to pay for your medications: Yes,  [employer] insurance  Release of information consent forms completed and in the chart;  Patient's signature needed at discharge.  Patient to Follow up at: Follow-up Information    [facility] Follow up on 05/25/2018.   Why:  You have an appointment with Berlin Hun scheduled for Wednesday 05/25/18 at 2:45PM. Telepsych is available if you do not feel comfortable going to the office, you will just have to call and let them know your preference. Thank You! Contact information: [address] [place], [place] [address] Phone: [phone] Fax: [phone]       [facility] Follow up on 05/25/2018.   Why:  You have a med management appointment scheduled for Wednesday 05/25/18 at 1:00 with Theresa Mulligan.. Please bring photo id and insurance card. If you need to cancel you must give a 24 hour notice. Thank You! Contact information: [address] [place] [address] [phone]           Next level of care provider has access to [facility] Link:no  Safety Planning and Suicide Prevention discussed: Yes,  SPE completed with pt and pts husband  Have you used any form of tobacco in the last 30 days? (Cigarettes, Smokeless Tobacco, Cigars, and/or Pipes): Yes  Has patient been referred to the Quitline?: Patient refused referral  Patient has been referred for addiction treatment: N/A  Mechele Dawley, LCSW 05/18/2018, 8:08 AM

## 2018-05-19 NOTE — Discharge Summary (Signed)
Physician Discharge Summary Note  Patient:  Laura Haney is an 58 y.o., female MRN:  [id number] DOB:  1960/11/02 Patient phone:  [phone] (home)  Patient address:   [address] [place] [address],  Total Time spent with patient: 45 minutes  Date of Admission:  05/14/2018 Date of Discharge: May 18, 2018  Reason for Admission: Patient was admitted to the hospital because of onset of more depressive mood alcohol abuse passive suicidal ideation and mood instability.  Principal Problem: Bipolar 2 disorder [facility]) Discharge Diagnoses: Principal Problem:   Bipolar 2 disorder (HCC) Active Problems:   Alcohol abuse   Past Psychiatric History: Past history of mood instability probably bipolar 2 and alcohol abuse.  Recently had been off of medication and not establishing treatment here in the community.  Past Medical History:  Past Medical History:  Diagnosis Date  . Bipolar 2 disorder (HCC)   . Depression   . Iron excess   . Left hip pain    History reviewed. No pertinent surgical history. Family History:  Family History  Problem Relation Age of Onset  . Heart disease Mother   . Cancer Brother        pancreatic  . Heart attack Paternal Grandfather    Family Psychiatric  History: See previous Social History:  Social History   Substance and Sexual Activity  Alcohol Use Yes     Social History   Substance and Sexual Activity  Drug Use Not Currently  . Types: Marijuana   Comment: occasionally    Social History   Socioeconomic History  . Marital status: Married    Spouse name: Not on file  . Number of children: Not on file  . Years of education: Not on file  . Highest education level: Not on file  Occupational History  . Not on file  Social Needs  . Financial resource strain: Not on file  . Food insecurity:    Worry: Not on file    Inability: Not on file  . Transportation needs:    Medical: Not on file    Non-medical: Not on file  Tobacco Use  . Smoking  status: Current Every Day Smoker    Packs/day: 1.50    Types: Cigarettes  . Smokeless tobacco: Never Used  Substance and Sexual Activity  . Alcohol use: Yes  . Drug use: Not Currently    Types: Marijuana    Comment: occasionally  . Sexual activity: Yes  Lifestyle  . Physical activity:    Days per week: Not on file    Minutes per session: Not on file  . Stress: Not on file  Relationships  . Social connections:    Talks on phone: Not on file    Gets together: Not on file    Attends religious service: Not on file    Active member of club or organization: Not on file    Attends meetings of clubs or organizations: Not on file    Relationship status: Not on file  Other Topics Concern  . Not on file  Social History Narrative  . Not on file    Hospital Course: Patient was admitted to the psychiatric service.  15-minute checks done.  Labs reviewed.  Patient was restarted on a combination of Seroquel Lamictal and Cymbalta which she had taken in the past.  Tolerated medicine without difficulty.  Did not need any symptomatic alcohol withdrawal treatment.  She engaged very appropriately in groups and activities on the unit.  Slept better.  By the time of discharge affect and mood were calm much less anxious.  No sign of acute dangerousness.  No suicidal ideation.  Good insight.  Agreeable to follow-up treatment as arranged in the community.  Prescriptions given at discharge.  Physical Findings: AIMS: Facial and Oral Movements Muscles of Facial Expression: None, normal Lips and Perioral Area: None, normal Jaw: None, normal Tongue: None, normal,Extremity Movements Upper (arms, wrists, hands, fingers): None, normal Lower (legs, knees, ankles, toes): None, normal, Trunk Movements Neck, shoulders, hips: None, normal, Overall Severity Severity of abnormal movements (highest score from questions above): None, normal Incapacitation due to abnormal movements: None, normal Patient's awareness of  abnormal movements (rate only patient's report): No Awareness, Dental Status Current problems with teeth and/or dentures?: No Does patient usually wear dentures?: No  CIWA:  CIWA-Ar Total: 0 COWS:     Musculoskeletal: Strength & Muscle Tone: within normal limits Gait & Station: normal Patient leans: N/A  Psychiatric Specialty Exam: Physical Exam  Nursing note and vitals reviewed. Constitutional: She appears well-developed and well-nourished.  HENT:  Head: Normocephalic and atraumatic.  Eyes: Pupils are equal, round, and reactive to light. Conjunctivae are normal.  Neck: Normal range of motion.  Cardiovascular: Regular rhythm and normal heart sounds.  Respiratory: Effort normal.  GI: Soft.  Musculoskeletal: Normal range of motion.  Neurological: She is alert.  Skin: Skin is warm and dry.  Psychiatric: She has a normal mood and affect. Her speech is normal and behavior is normal. Judgment and thought content normal. She is not agitated. Cognition and memory are normal.    Review of Systems  Constitutional: Negative.   HENT: Negative.   Eyes: Negative.   Respiratory: Negative.   Cardiovascular: Negative.   Gastrointestinal: Negative.   Musculoskeletal: Negative.   Skin: Negative.   Neurological: Negative.   Psychiatric/Behavioral: Negative.     Blood pressure 119/69, pulse 81, temperature 97.9 F (36.6 C), temperature source Oral, resp. rate 16, height 5\' 8"  (1.727 m), weight 75.3 kg, SpO2 100 %.Body mass index is 25.24 kg/m.  General Appearance: Casual  Eye Contact:  Good  Speech:  Clear and Coherent  Volume:  Normal  Mood:  Euthymic  Affect:  Congruent  Thought Process:  Goal Directed  Orientation:  Full (Time, Place, and Person)  Thought Content:  Logical  Suicidal Thoughts:  No  Homicidal Thoughts:  No  Memory:  Immediate;   Fair Recent;   Fair Remote;   Fair  Judgement:  Fair  Insight:  Fair  Psychomotor Activity:  Normal  Concentration:  Concentration:  Fair  Recall:  [employer] of Knowledge:  Fair  Language:  Fair  Akathisia:  No  Handed:  Right  AIMS (if indicated):     Assets:  Desire for Improvement Housing Physical Health  ADL's:  Intact  Cognition:  WNL  Sleep:  Number of Hours: 7.75     Have you used any form of tobacco in the last 30 days? (Cigarettes, Smokeless Tobacco, Cigars, and/or Pipes): Yes  Has this patient used any form of tobacco in the last 30 days? (Cigarettes, Smokeless Tobacco, Cigars, and/or Pipes) Yes, Yes, A prescription for an FDA-approved tobacco cessation medication was offered at discharge and the patient refused  Blood Alcohol level:  Lab Results  Component Value Date   ETH 237 (H) 05/13/2018    Metabolic Disorder Labs:  Lab Results  Component Value Date   HGBA1C 4.6 01/24/2018   No results found for: PROLACTIN Lab Results  Component Value Date   CHOL 210 (H) 01/24/2018   TRIG 145 01/24/2018   HDL 61 01/24/2018   LDLCALC 120 (H) 01/24/2018    See Psychiatric Specialty Exam and Suicide Risk Assessment completed by Attending Physician prior to discharge.  Discharge destination:  Home  Is patient on multiple antipsychotic therapies at discharge:  No   Has Patient had three or more failed trials of antipsychotic monotherapy by history:  No  Recommended Plan for Multiple Antipsychotic Therapies: NA  Discharge Instructions    Diet - low sodium heart healthy   Complete by:  As directed    Increase activity slowly   Complete by:  As directed      Allergies as of 05/18/2018      Reactions   Penicillins Other (See Comments)   Bad yeast infections      Medication List    STOP taking these medications   ciprofloxacin-dexamethasone OTIC suspension Commonly known as:  Ciprodex     TAKE these medications     Indication  DULoxetine 60 MG capsule Commonly known as:  CYMBALTA Take 1 capsule (60 mg total) by mouth daily.  Indication:  Major Depressive Disorder   lamoTRIgine 150 MG  tablet Commonly known as:  LAMICTAL Take 1 tablet (150 mg total) by mouth at bedtime.  Indication:  Depressive Phase of Manic-Depression   QUEtiapine 200 MG tablet Commonly known as:  SEROQUEL Take 1 tablet (200 mg total) by mouth at bedtime. What changed:  medication strength  Indication:  Depressive Phase of Manic-Depression      Follow-up Information    [facility] Follow up on 05/25/2018.   Why:  You have an appointment with Berlin Hun scheduled for Wednesday 05/25/18 at 2:45PM. Telepsych is available if you do not feel comfortable going to the office, you will just have to call and let them know your preference. Thank You! Contact information: [address] [place], [place] [address] Phone: [phone] Fax: [phone]       [facility] Follow up on 05/25/2018.   Why:  You have a med management appointment scheduled for Wednesday 05/25/18 at 1:00 with Theresa Mulligan.. Please bring photo id and insurance card. If you need to cancel you must give a 24 hour notice. Thank You! Contact information: [address] [place] [address] [phone]           Follow-up recommendations:  Activity:  Activity as tolerated Diet:  Heart healthy diet Other:  Follow-up with local mental health agencies at [facility]  Comments: Patient seems to be stable no sign of psychosis no sign of acute dangerousness.  Very agreeable to treatment plan.  Signed: Mordecai Rasmussen, MD 05/19/2018, 5:57 PM

## 2018-07-04 DIAGNOSIS — F17210 Nicotine dependence, cigarettes, uncomplicated: Secondary | ICD-10-CM | POA: Diagnosis not present

## 2018-07-07 DIAGNOSIS — H6123 Impacted cerumen, bilateral: Secondary | ICD-10-CM | POA: Diagnosis not present

## 2018-07-07 DIAGNOSIS — Z7689 Persons encountering health services in other specified circumstances: Secondary | ICD-10-CM | POA: Diagnosis not present

## 2018-10-15 ENCOUNTER — Emergency Department
Admission: EM | Admit: 2018-10-15 | Discharge: 2018-10-15 | Disposition: A | Discharge status: Home / Self Care | Payer: Commercial Managed Care - PPO | Attending: Emergency Medicine | Admitting: Emergency Medicine

## 2018-10-15 ENCOUNTER — Other Ambulatory Visit: Payer: Self-pay

## 2018-10-15 ENCOUNTER — Emergency Department: Payer: Commercial Managed Care - PPO

## 2018-10-15 DIAGNOSIS — F17210 Nicotine dependence, cigarettes, uncomplicated: Secondary | ICD-10-CM | POA: Insufficient documentation

## 2018-10-15 DIAGNOSIS — Z79899 Other long term (current) drug therapy: Secondary | ICD-10-CM | POA: Diagnosis not present

## 2018-10-15 DIAGNOSIS — R51 Headache: Secondary | ICD-10-CM | POA: Diagnosis not present

## 2018-10-15 DIAGNOSIS — R519 Headache, unspecified: Secondary | ICD-10-CM

## 2018-10-15 MED ORDER — SODIUM CHLORIDE 0.9 % IV BOLUS
1000.0000 mL | Freq: Once | INTRAVENOUS | Status: AC
  Administered 2018-10-15: 03:00:00 1000 mL via INTRAVENOUS

## 2018-10-15 MED ORDER — METOCLOPRAMIDE HCL 5 MG/ML IJ SOLN
10.0000 mg | Freq: Once | INTRAMUSCULAR | Status: AC
  Administered 2018-10-15: 10 mg via INTRAVENOUS
  Filled 2018-10-15: qty 2

## 2018-10-15 MED ORDER — KETOROLAC TROMETHAMINE 30 MG/ML IJ SOLN
30.0000 mg | Freq: Once | INTRAMUSCULAR | Status: AC
  Administered 2018-10-15: 03:00:00 30 mg via INTRAVENOUS
  Filled 2018-10-15: qty 1

## 2018-10-15 MED ORDER — DIPHENHYDRAMINE HCL 50 MG/ML IJ SOLN
50.0000 mg | Freq: Once | INTRAMUSCULAR | Status: AC
  Administered 2018-10-15: 03:00:00 50 mg via INTRAVENOUS
  Filled 2018-10-15: qty 1

## 2018-10-15 MED ORDER — BUTALBITAL-APAP-CAFFEINE 50-325-40 MG PO TABS: 1.0000 | ORAL_TABLET | Freq: Four times a day (QID) | ORAL | 0 refills | Status: DC | PRN

## 2018-10-15 NOTE — ED Triage Notes (Signed)
Patient states that she has had a headache for 2 weeks. Patient also has a history of alcohol use and has had 6 beers today.

## 2018-10-15 NOTE — ED Provider Notes (Signed)
[facility] Emergency Department Provider Note  Time seen: 2:27 AM  I have reviewed the triage vital signs and the nursing notes.   HISTORY  Chief Complaint Headache   HPI Laura Haney is a 58 y.o. female with a past medical history of bipolar, alcohol use, presents to the emergency department for headache.  According to the patient she had a history of migraine headaches as a younger adult in her 2920s, states she has not had migraine since then until the past 2 months.  States she has had approximately 7 migraine headaches over the past 2 months.  States tonight the headache would not go away moderate in severity so she came to the emergency department for evaluation.   Denies any fever cough congestion or shortness of breath.  Denies any weakness or numbness.  Past Medical History:  Diagnosis Date  . Bipolar 2 disorder (HCC)   . Depression   . Iron excess   . Left hip pain     Patient Active Problem List   Diagnosis Date Noted  . Alcohol abuse 05/16/2018  . Suicidal ideation 05/13/2018  . Bipolar 2 disorder (HCC) 01/24/2018  . Primary osteoarthritis of left hip 01/24/2018    History reviewed. No pertinent surgical history.  Prior to Admission medications   Medication Sig Start Date End Date Taking? Authorizing Provider  DULoxetine (CYMBALTA) 60 MG capsule Take 1 capsule (60 mg total) by mouth daily. 05/18/18   [name], Jackquline DenmarkJohn T, MD  lamoTRIgine (LAMICTAL) 150 MG tablet Take 1 tablet (150 mg total) by mouth at bedtime. 05/17/18   [name], Jackquline DenmarkJohn T, MD  QUEtiapine (SEROQUEL) 200 MG tablet Take 1 tablet (200 mg total) by mouth at bedtime. 05/17/18   [name], Jackquline DenmarkJohn T, MD    Allergies  Allergen Reactions  . Penicillins Other (See Comments)    Bad yeast infections    Family History  Problem Relation Age of Onset  . Heart disease Mother   . Cancer Brother        pancreatic  . Heart attack Paternal Grandfather     Social History Social History   Tobacco  Use  . Smoking status: Current Every Day Smoker    Packs/day: 1.50    Types: Cigarettes  . Smokeless tobacco: Never Used  Substance Use Topics  . Alcohol use: Yes  . Drug use: Not Currently    Types: Marijuana    Comment: occasionally    Review of Systems Constitutional: Negative for fever. ENT: Negative for recent illness/congestion Cardiovascular: Negative for chest pain. Respiratory: Negative for shortness of breath.  Negative for cough. Gastrointestinal: Negative for abdominal pain, vomiting Musculoskeletal: Negative for musculoskeletal complaints Skin: Negative for skin complaints  Neurological: Moderate headache All other ROS negative  ____________________________________________   PHYSICAL EXAM:  VITAL SIGNS: ED Triage Vitals  Enc Vitals Group     BP 10/15/18 0132 114/66     Pulse Rate 10/15/18 0132 95     Resp 10/15/18 0132 18     Temp 10/15/18 0132 98.5 F (36.9 C)     Temp Source 10/15/18 0132 Oral     SpO2 10/15/18 0132 97 %     Weight 10/15/18 0134 175 lb (79.4 kg)     Height 10/15/18 0134 5\' 8"  (1.727 m)     Head Circumference --      Peak Flow --      Pain Score 10/15/18 0134 8     Pain Loc --  Pain Edu? --      Excl. in [place]? --    Constitutional: Alert and oriented. Well appearing and in no distress. Eyes: Normal exam ENT      Head: Normocephalic and atraumatic.      Mouth/Throat: Mucous membranes are moist. Cardiovascular: Normal rate, regular rhythm. Respiratory: Normal respiratory effort without tachypnea nor retractions. Breath sounds are clear  Gastrointestinal: Soft and nontender. No distention.   Musculoskeletal: Nontender with normal range of motion in all extremities.  Neurologic:  Normal speech and language. No gross focal neurologic deficits.  Equal grip strengths.  Cranial nerves intact. Skin:  Skin is warm, dry and intact.  Psychiatric: Mood and affect are normal.    ____________________________________________   RADIOLOGY  CT negative  ____________________________________________   INITIAL IMPRESSION / ASSESSMENT AND PLAN / ED COURSE  Pertinent labs & imaging results that were available during my care of the patient were reviewed by me and considered in my medical decision making (see chart for details).   Patient presents to the emergency department for headache.  Described the headache as moderate in severity.  Has been experiencing intermittent headaches over the past 2 months.  Denies any recent changes such as medication changes.  Patient does drink alcohol on a daily basis including tonight.  Given the patient's new onset headaches will obtain CT imaging of the head as a precaution rule out mass/tumor/bleed.  Patient appears to be neurologically intact.  We will treat the patient's migraine with Toradol/Reglan/Benadryl, IV fluids and continue to closely monitor.  CT scan is negative for acute abnormality.  Patient is feeling better after medications.  We will discharge the patient home with neurology follow-up given her more frequent headaches.  Laura Haney was evaluated in Emergency Department on 10/15/2018 for the symptoms described in the history of present illness. She was evaluated in the context of the global COVID-19 pandemic, which necessitated consideration that the patient might be at risk for infection with the SARS-CoV-2 virus that causes COVID-19. Institutional protocols and algorithms that pertain to the evaluation of patients at risk for COVID-19 are in a state of rapid change based on information released by regulatory bodies including the CDC and federal and state organizations. These policies and algorithms were followed during the patient's care in the ED.  ____________________________________________   FINAL CLINICAL IMPRESSION(S) / ED DIAGNOSES  Headache   Harvest Dark, MD 10/15/18 [phone]

## 2018-11-07 ENCOUNTER — Other Ambulatory Visit: Payer: Self-pay | Admitting: Family Medicine

## 2018-11-07 NOTE — Telephone Encounter (Signed)
Requested medication (s) are due for refill today: yes  Requested medication (s) are on the active medication list: yes  Future visit scheduled: no  Notes to clinic: The original prescription was discontinued on 05/14/2018 by Chong Sicilian, DO. Renewing this prescription may not be appropriate   Requested Prescriptions  Pending Prescriptions Disp Refills   ibuprofen (ADVIL) 800 MG tablet [Pharmacy Med Name: IBUPROFEN 800MG  TABLETS] 90 tablet 6    Sig: TAKE 1 TABLET BY MOUTH THREE TIMES DAILY AFTER MEALS     Analgesics:  NSAIDS Passed - 11/07/2018  3:13 AM      Passed - Cr in normal range and within 360 days    Creatinine, Ser  Date Value Ref Range Status  05/13/2018 0.80 0.44 - 1.00 mg/dL Final         Passed - HGB in normal range and within 360 days    Hemoglobin  Date Value Ref Range Status  05/13/2018 14.1 12.0 - 15.0 g/dL Final  01/24/2018 12.6 11.1 - 15.9 g/dL Final         Passed - Patient is not pregnant      Passed - Valid encounter within last 12 months    Recent Outpatient Visits          9 months ago Acute swimmer's ear of right side   [employer], [place], DO

## 2018-11-16 ENCOUNTER — Other Ambulatory Visit: Payer: Self-pay | Admitting: Family Medicine

## 2019-01-09 ENCOUNTER — Other Ambulatory Visit: Payer: Self-pay | Admitting: Nurse Practitioner

## 2019-01-16 ENCOUNTER — Ambulatory Visit: Payer: Commercial Managed Care - PPO

## 2019-01-16 ENCOUNTER — Other Ambulatory Visit: Payer: Self-pay | Admitting: Nurse Practitioner

## 2019-01-16 ENCOUNTER — Ambulatory Visit
Admission: RE | Admit: 2019-01-16 | Discharge: 2019-01-16 | Disposition: A | Discharge status: Home / Self Care | Payer: Commercial Managed Care - PPO | Source: Ambulatory Visit | Attending: Nurse Practitioner | Admitting: Nurse Practitioner

## 2019-01-16 ENCOUNTER — Other Ambulatory Visit: Payer: Self-pay

## 2019-01-17 ENCOUNTER — Other Ambulatory Visit: Payer: Self-pay

## 2019-01-17 ENCOUNTER — Ambulatory Visit
Admission: RE | Admit: 2019-01-17 | Discharge: 2019-01-17 | Disposition: A | Discharge status: Home / Self Care | Payer: Commercial Managed Care - PPO | Source: Ambulatory Visit | Attending: Nurse Practitioner | Admitting: Nurse Practitioner

## 2019-01-28 ENCOUNTER — Other Ambulatory Visit: Payer: Self-pay

## 2019-01-28 DIAGNOSIS — Z20822 Contact with and (suspected) exposure to covid-19: Secondary | ICD-10-CM

## 2019-01-31 LAB — NOVEL CORONAVIRUS, NAA: SARS-CoV-2, NAA: NOT DETECTED

## 2019-02-13 NOTE — Progress Notes (Signed)
[facility]  [address], [address] [place], [place] [address] Phone: [phone]  Fax: [phone]   Clinic Day:  02/15/2019  Referring physician: Sallee Lange, *  Chief Complaint: Laura Haney is a 58 y.o. female with hereditary hemochromatosis who is referred in consultation by Gaetano Net, NP for assessment and management.   HPI:  The patient was diagnosed with hereditary hemochromatosis in 2010.  She found out she had hemochromatosis after her brother tested positive for hemochromatosis. She initially underwent  phlebotomies twice a week which then trickled down to once a week in the past.  Ferritin goal was <50. She can tolerate 500 cc phlebotomies. She has not had a phlebotomy in 4 years. She denies regular follow up RUQ ultrasounds for [facility] surveillance.  The patient had a follow up with Gaetano Net, NP on 01/09/2019. Gauger, NP noted the patient had chronic hemochromatosis. Labs on 01/09/2019 revealed hematocrit 44.2, hemoglobin 15.2, MCV 100.0, platelets 174,000, WBC 6,800. Creatinine 0.9. AST was 27, ALT 25, bilirubin 1.2, and alkaline phophatase was 129 (34-104). Ferritin was 146 with an iron saturation of 88% and a TIBC of 332.4. INR was 0.9. GFR was 65. Hepatitis C antibody was <0.1.   RUQ abdominal ultrasound with elastography on 01/17/2019 revealed a 7 mm left hepatic lobe cyst, benign.  Labs followed: 01/24/2018:  Hematocrit 34.7, hemoglobin 12.6, MCV 95.0, platelets 176,000, WBC 4,600. 05/13/2018:  Hematocrit 39.4, hemoglobin 14.1, MCV 96.8, platelets 169,000, WBC 5,700. 01/09/2019:  Hematocrit 44.2, hemoglobin 15.2, MCV 100.0, platelets 174,000, WBC 6,800.   Symptomatically, she feels "great".  She notes a good diet. She does not eat a lot of vegetables unless she goes out for dinner. She eats meat daily. Her mother-in-law does not cook as much anymore. She notes that she "stays out of the kitchen" and will eat out most of the time. She  notes weight gain secondary to stress from moving to [place], [place], finances, and nervous break down. She has been sober x 1 month. I advised patient to decrease alcohol consumption.   She has shortness of breath on exertion secondary to bipolar depression. She denies any B symptoms. She has no skin changes. She has never had pancreatitis. She has no nausea, vomiting or diarrhea. She has left hip pain. She will take Motrin once daily and stretches daily to relieve pain.   She is up to date on her colonoscopy. She is due for a mammogram; she has not had a mammogram in 2 1/2 years.    Past Medical History:  Diagnosis Date  . Bipolar 2 disorder ([place])   . Depression   . Iron excess   . Left hip pain     History reviewed. No pertinent surgical history.  Family History  Problem Relation Age of Onset  . Heart disease Mother   . Cancer Brother        pancreatic  . Heart attack Paternal Grandfather   One of her brothers was tested for hepatitis B; he was diagnosed with hemochromatosis. He later passed secondary to having pancreatic cancer and hepatitis B. Her other brother and mother do no have hemochromatosis.  She is unaware if her father had it because he passed when she was 58 years old.    Social History:  reports that she has been smoking cigarettes. She has a 60.00 pack-year smoking history. She has never used smokeless tobacco. She reports previous alcohol use. She reports previous drug use.   She smokes.  She  reported being diagnosed with alcoholism. She has been an alcoholic for 25 years. She drank 10 beers daily. She has been sober for 1 month. Her husband used to drink 7 beers a night and he now drinks 4 beers a night. Both have cut back on drinking beer daily.  She used to live in [facility] in [place], [place]. She reports being sober for x 3 years before they moved to [place], [place] 2 1/2 years ago. Her mother-in-law lives with them. Patient is married to her husband, Laura Haney.   Patient has had 3 pregnancies. She has 2 sons (ages 56 and 21). Only one son has been tested for hemochromatosis. 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  Allergies:  Allergies  Allergen Reactions  . Penicillins Other (See Comments)    Bad yeast infections    Current Medications: Current Outpatient Medications  Medication Sig Dispense Refill  . cholecalciferol (VITAMIN D3) 25 MCG (1000 UT) tablet Take 1,000 Units by mouth daily.    . DULoxetine (CYMBALTA) 60 MG capsule Take 1 capsule (60 mg total) by mouth daily. 30 capsule 1  . ibuprofen (ADVIL) 800 MG tablet Take 800 mg by mouth every 6 (six) hours as needed.     . lamoTRIgine (LAMICTAL) 150 MG tablet Take 1 tablet (150 mg total) by mouth at bedtime. 30 tablet 1  . QUEtiapine (SEROQUEL) 200 MG tablet Take 1 tablet (200 mg total) by mouth at bedtime. 30 tablet 1   No current facility-administered medications for this visit.    Review of Systems  Constitutional: Negative for chills, diaphoresis, fever, malaise/fatigue and weight loss (gaining).       Feels "great".  HENT: Negative for congestion, hearing loss, sinus pain and sore throat.   Eyes: Negative for blurred vision.  Respiratory: Positive for shortness of breath ("not in shape"). Negative for cough, hemoptysis and sputum production.   Cardiovascular: Negative for chest pain, palpitations and leg swelling.  Gastrointestinal: Negative for abdominal pain, blood in stool, constipation, diarrhea, melena, nausea and vomiting.       Good diet.  Colonoscopy UTD.  Genitourinary: Negative for dysuria, frequency, hematuria and urgency.  Musculoskeletal: Positive for joint pain (left hip; on Motrin). Negative for back pain, myalgias and neck pain.  Skin: Negative for itching and rash.  Neurological: Negative for dizziness, tingling, sensory change, speech change, focal weakness, weakness and headaches.  Endo/Heme/Allergies: Does not bruise/bleed  easily.  Psychiatric/Behavioral: Negative for depression and memory loss. The patient is not nervous/anxious and does not have insomnia.        Bipolar depression.  All other systems reviewed and are negative.  Performance status (ECOG): 0-1  Vitals Blood pressure 119/79, pulse 86, temperature (!) 97 F (36.1 C), temperature source Tympanic, resp. rate 16, height 5\' 8"  (1.727 m), weight 177 lb 11.1 oz (80.6 kg), SpO2 98 %.   Physical Exam  Constitutional: She is oriented to person, place, and time. She appears well-developed and well-nourished. No distress.  HENT:  Head: Normocephalic and atraumatic.  Mouth/Throat: Oropharynx is clear and moist. No oropharyngeal exudate.  Short styled white hair.  Mask.  Eyes: Pupils are equal, round, and reactive to light. Conjunctivae and EOM are normal. No scleral icterus.  Blue eyes.  Cardiovascular: Normal rate and regular rhythm.  No murmur heard. Pulmonary/Chest: Effort normal and breath sounds normal. No respiratory distress. She has no wheezes. She has no rales. She exhibits no tenderness.  Abdominal: Soft. Bowel  sounds are normal. She exhibits no distension and no mass. There is no abdominal tenderness. There is no rebound and no guarding.  Musculoskeletal:        General: No tenderness or edema. Normal range of motion.     Cervical back: Normal range of motion and neck supple.  Lymphadenopathy:       Head (right side): No preauricular, no posterior auricular and no occipital adenopathy present.       Head (left side): No preauricular, no posterior auricular and no occipital adenopathy present.    She has no cervical adenopathy.    She has no axillary adenopathy.       Right: No inguinal and no supraclavicular adenopathy present.       Left: No inguinal and no supraclavicular adenopathy present.  Neurological: She is alert and oriented to person, place, and time.  Skin: Skin is warm and dry. No rash noted. She is not diaphoretic. No  erythema. No pallor.  Psychiatric: She has a normal mood and affect. Her behavior is normal. Judgment and thought content normal.  Nursing note and vitals reviewed.   No visits with results within 3 Day(s) from this visit.  Latest known visit with results is:  Orders Only on 01/28/2019  Component Date Value Ref Range Status  . SARS-CoV-2, NAA 01/28/2019 Not Detected  Not Detected Final   Comment: This nucleic acid amplification test was developed and its performance characteristics determined by [facility]. Nucleic acid amplification tests include PCR and TMA. This test has not been FDA cleared or approved. This test has been authorized by FDA under an Emergency Use Authorization (EUA). This test is only authorized for the duration of time the declaration that circumstances exist justifying the authorization of the emergency use of in vitro diagnostic tests for detection of SARS-CoV-2 virus and/or diagnosis of COVID-19 infection under section 564(b)(1) of the Act, 21 U.S.C. 161WRU-0(A360bbb-3(b) (1), unless the authorization is terminated or revoked sooner. When diagnostic testing is negative, the possibility of a false negative result should be considered in the context of a patient's recent exposures and the presence of clinical signs and symptoms consistent with COVID-19. An individual without symptoms of COVID-19 and who is not shedding SARS-CoV-2 virus would                           expect to have a negative (not detected) result in this assay.     Assessment:  Laura Haney is a 58 y.o. female with hereditary hemochromatosis.  She was diagnosed 10 [place] ago in [place] (no records available).  She has been on a phlebotomy program.  Ferritin goal was < 50.  Last phlebotomy was 4 years ago.  Ferritin has been followed:  116 on 02/15/2019.  RUQ abdominal ultrasound with elastography on 01/17/2019 revealed a 7 mm left hepatic lobe cyst, benign.  She has a history of  alcoholism.  She has been sober x 1 month.  Hepatitis C testing was negative on 01/09/2019.  She has received the hepatitis B vaccine series.   Symptomatically, she feels "pretty good".  Exam reveals no hepatomegaly.  Diet is good.  Plan: 1.   Labs today:  CBC with diff, ferritin, AFP. 2.   Hereditary hemochromatosis  Discuss diagnosis and management of hemochromatosis.  Discuss phlebotomy to maintain ferritin 50-100.  Discuss importance of avoiding alcohol.  Discuss AFP and RUQ ultrasound [facility].  Discuss genetics  of hemochromatosis. 3.   Health maintenance  Encourage yearly mammogram (last 2 years ago) 4.   ROI for Dr Herbert Seta [facility] in [place] (ph: [phone]). 5.   Phlebotomy 500 ml today. 6.   RTC monthly x 2 for labs (CBC, ferritin) and +/- phlebotomy.  (RN to call patient with ferritin). 7.   RTC in 3 months for MD assessment, labs (CBC with diff, CMP, ferritin- day before) and +/- phlebotomy.  I discussed the assessment and treatment plan with the patient.  The patient was provided an opportunity to ask questions and all were answered.  The patient agreed with the plan and demonstrated an understanding of the instructions.  The patient was advised to call back if the symptoms worsen or if the condition fails to improve as anticipated.  I provided 31 minutes of face-to-face time during this this encounter and > 50% was spent counseling as documented under my assessment and plan.    Evetta Renner C. Merlene Pulling, MD, PhD    02/15/2019, 10:42 AM  I, Theador Hawthorne, am acting as scribe for Ashby Moskal C. Merlene Pulling, MD, PhD.  I, Kassidy Dockendorf C. Merlene Pulling, MD, have reviewed the above documentation for accuracy and completeness, and I agree with the above.

## 2019-02-15 ENCOUNTER — Inpatient Hospital Stay: Payer: Commercial Managed Care - PPO | Attending: Hematology and Oncology | Admitting: Hematology and Oncology

## 2019-02-15 ENCOUNTER — Encounter: Payer: Self-pay | Admitting: Hematology and Oncology

## 2019-02-15 ENCOUNTER — Inpatient Hospital Stay: Payer: Commercial Managed Care - PPO

## 2019-02-15 ENCOUNTER — Telehealth: Payer: Self-pay | Admitting: *Deleted

## 2019-02-15 ENCOUNTER — Other Ambulatory Visit: Payer: Self-pay

## 2019-02-15 DIAGNOSIS — F17210 Nicotine dependence, cigarettes, uncomplicated: Secondary | ICD-10-CM | POA: Diagnosis not present

## 2019-02-15 DIAGNOSIS — F1021 Alcohol dependence, in remission: Secondary | ICD-10-CM | POA: Insufficient documentation

## 2019-02-15 DIAGNOSIS — Z800 Family history of malignant neoplasm of digestive organs: Secondary | ICD-10-CM | POA: Diagnosis not present

## 2019-02-15 DIAGNOSIS — F3181 Bipolar II disorder: Secondary | ICD-10-CM | POA: Insufficient documentation

## 2019-02-15 LAB — CBC WITH DIFFERENTIAL/PLATELET
Abs Immature Granulocytes: 0.01 10*3/uL (ref 0.00–0.07)
Basophils Absolute: 0.1 10*3/uL (ref 0.0–0.1)
Basophils Relative: 2 %
Eosinophils Absolute: 0.2 10*3/uL (ref 0.0–0.5)
Eosinophils Relative: 3 %
HCT: 42 % (ref 36.0–46.0)
Hemoglobin: 14.7 g/dL (ref 12.0–15.0)
Immature Granulocytes: 0 %
Lymphocytes Relative: 26 %
Lymphs Abs: 1.5 10*3/uL (ref 0.7–4.0)
MCH: 34.2 pg — ABNORMAL HIGH (ref 26.0–34.0)
MCHC: 35 g/dL (ref 30.0–36.0)
MCV: 97.7 fL (ref 80.0–100.0)
Monocytes Absolute: 0.3 10*3/uL (ref 0.1–1.0)
Monocytes Relative: 6 %
Neutro Abs: 3.7 10*3/uL (ref 1.7–7.7)
Neutrophils Relative %: 63 %
Platelets: 146 10*3/uL — ABNORMAL LOW (ref 150–400)
RBC: 4.3 MIL/uL (ref 3.87–5.11)
RDW: 14.2 % (ref 11.5–15.5)
WBC: 5.8 10*3/uL (ref 4.0–10.5)
nRBC: 0 % (ref 0.0–0.2)

## 2019-02-15 LAB — FERRITIN: Ferritin: 116 ng/mL (ref 11–307)

## 2019-02-15 NOTE — Telephone Encounter (Signed)
Lab called to say that the Iron lab coagulated and that they will not be able to get a result.  The lab may be able to do a ferritin.  MD team made aware.

## 2019-02-15 NOTE — Progress Notes (Signed)
Patient here for new patient referral from Dr. Dayton Martes for Hemochromatosis. Denies any concerns.

## 2019-02-16 LAB — AFP TUMOR MARKER: AFP, Serum, Tumor Marker: 2.8 ng/mL (ref 0.0–8.3)

## 2019-03-14 ENCOUNTER — Other Ambulatory Visit: Payer: Self-pay

## 2019-03-15 ENCOUNTER — Inpatient Hospital Stay: Payer: Commercial Managed Care - PPO | Attending: Hematology and Oncology

## 2019-03-15 ENCOUNTER — Inpatient Hospital Stay: Payer: Commercial Managed Care - PPO

## 2019-03-15 LAB — FERRITIN: Ferritin: 95 ng/mL (ref 11–307)

## 2019-03-15 LAB — CBC
HCT: 41.1 % (ref 36.0–46.0)
Hemoglobin: 14.2 g/dL (ref 12.0–15.0)
MCH: 33.8 pg (ref 26.0–34.0)
MCHC: 34.5 g/dL (ref 30.0–36.0)
MCV: 97.9 fL (ref 80.0–100.0)
Platelets: 179 10*3/uL (ref 150–400)
RBC: 4.2 MIL/uL (ref 3.87–5.11)
RDW: 14.5 % (ref 11.5–15.5)
WBC: 5.3 10*3/uL (ref 4.0–10.5)
nRBC: 0 % (ref 0.0–0.2)

## 2019-03-15 NOTE — Progress Notes (Signed)
Patient's ferritin was 95 which is in the parameter of 50-100.  No phlebotomy today.

## 2019-04-12 ENCOUNTER — Inpatient Hospital Stay: Payer: Commercial Managed Care - PPO

## 2019-04-14 ENCOUNTER — Other Ambulatory Visit: Payer: Self-pay

## 2019-04-17 ENCOUNTER — Inpatient Hospital Stay: Payer: Commercial Managed Care - PPO | Attending: Hematology and Oncology

## 2019-04-17 ENCOUNTER — Other Ambulatory Visit: Payer: Self-pay

## 2019-04-17 ENCOUNTER — Inpatient Hospital Stay: Payer: Commercial Managed Care - PPO

## 2019-04-17 LAB — CBC
HCT: 41.6 % (ref 36.0–46.0)
Hemoglobin: 14.6 g/dL (ref 12.0–15.0)
MCH: 33.3 pg (ref 26.0–34.0)
MCHC: 35.1 g/dL (ref 30.0–36.0)
MCV: 95 fL (ref 80.0–100.0)
Platelets: 176 10*3/uL (ref 150–400)
RBC: 4.38 MIL/uL (ref 3.87–5.11)
RDW: 14.4 % (ref 11.5–15.5)
WBC: 5.6 10*3/uL (ref 4.0–10.5)
nRBC: 0 % (ref 0.0–0.2)

## 2019-04-17 LAB — FERRITIN: Ferritin: 115 ng/mL (ref 11–307)

## 2019-04-17 NOTE — Patient Instructions (Signed)

## 2019-05-09 ENCOUNTER — Other Ambulatory Visit: Payer: Self-pay

## 2019-05-09 ENCOUNTER — Inpatient Hospital Stay: Payer: Commercial Managed Care - PPO | Attending: Hematology and Oncology

## 2019-05-09 LAB — COMPREHENSIVE METABOLIC PANEL
ALT: 22 U/L (ref 0–44)
AST: 24 U/L (ref 15–41)
Albumin: 4.2 g/dL (ref 3.5–5.0)
Alkaline Phosphatase: 90 U/L (ref 38–126)
Anion gap: 8 (ref 5–15)
BUN: 17 mg/dL (ref 6–20)
CO2: 25 mmol/L (ref 22–32)
Calcium: 8.6 mg/dL — ABNORMAL LOW (ref 8.9–10.3)
Chloride: 103 mmol/L (ref 98–111)
Creatinine, Ser: 0.79 mg/dL (ref 0.44–1.00)
GFR calc Af Amer: >60 mL/min (ref >60)
GFR calc non Af Amer: >60 mL/min (ref >60)
Glucose, Bld: 85 mg/dL (ref 70–99)
Potassium: 4.3 mmol/L (ref 3.5–5.1)
Sodium: 136 mmol/L (ref 135–145)
Total Bilirubin: 1.2 mg/dL (ref 0.3–1.2)
Total Protein: 7 g/dL (ref 6.5–8.1)

## 2019-05-09 LAB — CBC WITH DIFFERENTIAL/PLATELET
Abs Immature Granulocytes: 0.02 10*3/uL (ref 0.00–0.07)
Basophils Absolute: 0.1 10*3/uL (ref 0.0–0.1)
Basophils Relative: 1 %
Eosinophils Absolute: 0.1 10*3/uL (ref 0.0–0.5)
Eosinophils Relative: 1 %
HCT: 41.1 % (ref 36.0–46.0)
Hemoglobin: 14.3 g/dL (ref 12.0–15.0)
Immature Granulocytes: 0 %
Lymphocytes Relative: 28 %
Lymphs Abs: 2 10*3/uL (ref 0.7–4.0)
MCH: 33.6 pg (ref 26.0–34.0)
MCHC: 34.8 g/dL (ref 30.0–36.0)
MCV: 96.5 fL (ref 80.0–100.0)
Monocytes Absolute: 0.5 10*3/uL (ref 0.1–1.0)
Monocytes Relative: 7 %
Neutro Abs: 4.5 10*3/uL (ref 1.7–7.7)
Neutrophils Relative %: 63 %
Platelets: 162 10*3/uL (ref 150–400)
RBC: 4.26 MIL/uL (ref 3.87–5.11)
RDW: 15.9 % — ABNORMAL HIGH (ref 11.5–15.5)
WBC: 7.2 10*3/uL (ref 4.0–10.5)
nRBC: 0 % (ref 0.0–0.2)

## 2019-05-09 LAB — FERRITIN: Ferritin: 112 ng/mL (ref 11–307)

## 2019-05-09 NOTE — Progress Notes (Signed)
[facility]  [address], [address] [place], [place] [address] Phone: [phone]  Fax: [phone]   Clinic Day:  05/10/2019  Referring physician: Myrene Buddy, *  Chief Complaint: Laura Haney is a 59 y.o. female with hereditary hemochromatosis who is seen for 3 month assessment.  HPI:  The patient was last seen in the hematology clinic on 02/15/2019 for initial consultation.  She had been diagnosed 10 years prior in [facility] (no records available).  She had been on a phlebotomy program.  Ferritin goal was < 50-100.  Last phlebotomy was 4 years ago.  Symptomatically, she felt "pretty good".  Exam revealed no hepatomegaly.  Diet was good.  Ferritin was 116.  She underwent phlebotomy (500cc).  Labs followed: 02/15/2019: Hematocrit 42.0, hemoglobin 14.7, MCV 97.7, platelets 146,000, WBC 5,800.  Ferritin 116. 03/15/2019: Hematocrit 41.1, hemoglobin 14.2, MCV 97.9, platelets 179,000, WBC 5,300.  Ferritin 95. 04/17/2019: Hematocrit 41.6, hemoglobin 14.6, MCV 95.0, platelets 176,000, WBC 5,600.  Ferritin 115. 05/09/2019: Hematocrit 41.1, hemoglobin 14.3, MCV 96.5, platelets 162,000, WBC 7,200.  Ferritin 112.  She underwent phlebotomy on 03/15/2019 and 04/17/2019.  She was seen in consultation by Dr. Viviano Simas at [facility] for her chronic low back and hip pain. Plain films revealed L3-L4, L4-L5, and L5-S1 significant degenerative disc space narrowing with no subluxation and mild osteophyte formation. The pelvis revealed severe left hip acetabular joint space narrowing with collapse and acetabular osteophyte formation. She notes that she will need a hip replacement. She is waiting to get an MRI before she can schedule her surgery.   During the interim, her husband passed away. She is worried about her insurance because she was on her husband's insurance. She loves milkshakes and has milk, ice cream, or milkshakes almost every night.    Past  Medical History:  Diagnosis Date  . Bipolar 2 disorder (HCC)   . Depression   . Iron excess   . Left hip pain     History reviewed. No pertinent surgical history.  Family History  Problem Relation Age of Onset  . Heart disease Mother   . Cancer Brother        pancreatic  . Heart attack Paternal Grandfather   One of her brothers was tested for hepatitis B; he was diagnosed with hemochromatosis. He later passed secondary to having pancreatic cancer and hepatitis B. Her other brother and mother do no have hemochromatosis.  She is unaware if her father had it because he passed when she was 59 years old.    Social History:  reports that she has been smoking cigarettes. She has a 60.00 pack-year smoking history. She has never used smokeless tobacco. She reports previous alcohol use. She reports previous drug use.   She smokes.  She reported being diagnosed with alcoholism. She has been an alcoholic for 25 years. She drank 10 beers daily. She has been sober for 1 month. Her husband used to drink 7 beers a night and he now drinks 4 beers a night. Both have cut back on drinking beer daily.  She used to live in [facility] in [place], [place]. She reports being sober for x 3 years before they moved to [place], [place] 2 1/2 years ago. Her mother-in-law lives with them. Her husband, Ethelene Browns, died on 2019/04/22 with liver cancer.  Patient has had 3 pregnancies. She has 2 sons (ages 75 and 34). Only one son has been tested for hemochromatosis. 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   Allergies:  Allergies  Allergen Reactions  . Hydrocodone Other (See Comments)    Makes patient angry  . Penicillins Other (See Comments)    Bad yeast infections    Current Medications: Current Outpatient Medications  Medication Sig Dispense Refill  . cholecalciferol (VITAMIN D3) 25 MCG (1000 UT) tablet Take 1,000 Units by mouth daily.    . DULoxetine (CYMBALTA) 60 MG capsule  Take 1 capsule (60 mg total) by mouth daily. 30 capsule 1  . ibuprofen (ADVIL) 800 MG tablet Take 800 mg by mouth every 6 (six) hours as needed.     . lamoTRIgine (LAMICTAL) 150 MG tablet Take 1 tablet (150 mg total) by mouth at bedtime. 30 tablet 1  . QUEtiapine (SEROQUEL) 200 MG tablet Take 1 tablet (200 mg total) by mouth at bedtime. 30 tablet 1   No current facility-administered medications for this visit.    Review of Systems  Constitutional: Positive for weight loss (1 pound). Negative for chills, diaphoresis, fever and malaise/fatigue.       Feels "ok".  HENT: Negative for congestion, ear pain, nosebleeds, sinus pain and sore throat.   Eyes: Negative.  Negative for blurred vision, double vision and photophobia.  Respiratory: Positive for shortness of breath (exertional). Negative for cough and sputum production.   Cardiovascular: Negative.  Negative for chest pain, palpitations, orthopnea, claudication and leg swelling.  Gastrointestinal: Negative for abdominal pain, blood in stool, constipation, diarrhea, heartburn, melena, nausea and vomiting.       Good diet.  Colonoscopy UTD.  Genitourinary: Negative.  Negative for dysuria, frequency, hematuria and urgency.  Musculoskeletal: Positive for joint pain (left hip on Motrin). Negative for back pain, myalgias and neck pain.  Skin: Negative.  Negative for itching and rash.  Neurological: Negative.  Negative for dizziness, tingling, sensory change, speech change, focal weakness, weakness and headaches.  Endo/Heme/Allergies: Negative.  Does not bruise/bleed easily.  Psychiatric/Behavioral: Negative for depression and memory loss. The patient does not have insomnia.        Bipolar depression.  Husband passed on Apr 16, 2019.  All other systems reviewed and are negative.  Performance status (ECOG): 1   Vitals Blood pressure 120/83, pulse 96, temperature (!) 96.8 F (36 C), temperature source Tympanic, resp. rate 18, weight 176 lb 5.9 oz  (80 kg), SpO2 97 %.   Physical Exam  Constitutional: She is oriented to person, place, and time. She appears well-developed and well-nourished. No distress.  HENT:  Head: Normocephalic and atraumatic.  Mouth/Throat: Oropharynx is clear and moist. No oropharyngeal exudate.  Short styled white hair.  Mask.  Eyes: Pupils are equal, round, and reactive to light. Conjunctivae are normal. No scleral icterus.  Blue eyes.  Neck: No JVD present.  Cardiovascular: Normal rate, regular rhythm and normal heart sounds. Exam reveals no gallop.  No murmur heard. Pulmonary/Chest: Effort normal and breath sounds normal. No respiratory distress. She has no wheezes. She has no rales. She exhibits no tenderness.  Abdominal: Soft. Bowel sounds are normal. She exhibits no distension and no mass. There is no abdominal tenderness. There is no rebound and no guarding.  Musculoskeletal:        General: No tenderness or edema. Normal range of motion.     Cervical back: Normal range of motion and neck supple.  Lymphadenopathy:       Head (right side): No preauricular, no posterior auricular and no occipital adenopathy present.  Head (left side): No preauricular, no posterior auricular and no occipital adenopathy present.    She has no cervical adenopathy.    She has no axillary adenopathy.       Right: No inguinal adenopathy present.       Left: No inguinal and no supraclavicular adenopathy present.  Neurological: She is oriented to person, place, and time.  Skin: Skin is warm and dry. No rash noted. She is not diaphoretic. No erythema.  Psychiatric: She has a normal mood and affect. Her behavior is normal. Judgment and thought content normal.  Nursing note and vitals reviewed.   Appointment on 05/09/2019  Component Date Value Ref Range Status  . Sodium 05/09/2019 136  135 - 145 mmol/L Final  . Potassium 05/09/2019 4.3  3.5 - 5.1 mmol/L Final  . Chloride 05/09/2019 103  98 - 111 mmol/L Final  . CO2  05/09/2019 25  22 - 32 mmol/L Final  . Glucose, Bld 05/09/2019 85  70 - 99 mg/dL Final   Glucose reference range applies only to samples taken after fasting for at least 8 hours.  . BUN 05/09/2019 17  6 - 20 mg/dL Final  . Creatinine, Ser 05/09/2019 0.79  0.44 - 1.00 mg/dL Final  . Calcium 19/37/9024 8.6* 8.9 - 10.3 mg/dL Final  . Total Protein 05/09/2019 7.0  6.5 - 8.1 g/dL Final  . Albumin 09/73/5329 4.2  3.5 - 5.0 g/dL Final  . AST 92/42/6834 24  15 - 41 U/L Final  . ALT 05/09/2019 22  0 - 44 U/L Final  . Alkaline Phosphatase 05/09/2019 90  38 - 126 U/L Final  . Total Bilirubin 05/09/2019 1.2  0.3 - 1.2 mg/dL Final  . GFR calc non Af Amer 05/09/2019 >60  >60 mL/min Final  . GFR calc Af Amer 05/09/2019 >60  >60 mL/min Final  . Anion gap 05/09/2019 8  5 - 15 Final   Performed at [facility], [address] [place], [place] [address]  . Ferritin 05/09/2019 112  11 - 307 ng/mL Final   Performed at [facility], [address] [place] [place], [place] [address]  . WBC 05/09/2019 7.2  4.0 - 10.5 K/uL Final  . RBC 05/09/2019 4.26  3.87 - 5.11 MIL/uL Final  . Hemoglobin 05/09/2019 14.3  12.0 - 15.0 g/dL Final  . HCT 92/12/9415 41.1  36.0 - 46.0 % Final  . MCV 05/09/2019 96.5  80.0 - 100.0 fL Final  . MCH 05/09/2019 33.6  26.0 - 34.0 pg Final  . MCHC 05/09/2019 34.8  30.0 - 36.0 g/dL Final  . RDW 40/81/4481 15.9* 11.5 - 15.5 % Final  . Platelets 05/09/2019 162  150 - 400 K/uL Final  . nRBC 05/09/2019 0.0  0.0 - 0.2 % Final  . Neutrophils Relative % 05/09/2019 63  % Final  . Neutro Abs 05/09/2019 4.5  1.7 - 7.7 K/uL Final  . Lymphocytes Relative 05/09/2019 28  % Final  . Lymphs Abs 05/09/2019 2.0  0.7 - 4.0 K/uL Final  . Monocytes Relative 05/09/2019 7  % Final  . Monocytes Absolute 05/09/2019 0.5  0.1 - 1.0 K/uL Final  . Eosinophils Relative 05/09/2019 1  % Final  . Eosinophils Absolute 05/09/2019 0.1  0.0 - 0.5 K/uL Final  . Basophils Relative 05/09/2019 1  % Final   . Basophils Absolute 05/09/2019 0.1  0.0 - 0.1 K/uL Final  . Immature Granulocytes 05/09/2019 0  % Final  . Abs Immature Granulocytes 05/09/2019 0.02  0.00 - 0.07 K/uL Final   Performed at [facility], [address] [place], [place] [address]    Assessment:  Laura Haney is a 59 y.o. female with hereditary hemochromatosis.  She was diagnosed 10 years ago in [place], [place] (no records available).  She has been on a phlebotomy program.  Ferritin goal was < 50.  Last phlebotomy was 04/17/2019.  Ferritin has been followed:  116 on 02/15/2019, 85 on 03/15/2019, 115 on 04/17/2019 and 112 on 05/09/2019.  RUQ abdominal ultrasound with elastography on 01/17/2019 revealed a 7 mm left hepatic lobe cyst, benign.  AFP has been followed:  2.8 on 02/15/2019.  She has a history of alcoholism.  She has been sober x 1 month.  Hepatitis C testing was negative on 01/09/2019.  She has received the hepatitis B vaccine series.   Symptomatically, she notes left hip pain.  She is planning on a hip replacement.  Exam is stable.  Plan: 1.   Review labs from 05/09/2019. 2.   Hereditary hemochromatosis  Clinically, she is doing well.  Hematocrit 41.1.  Hemoglobin 14.3.  MCV 96.5.    Ferritin is 112.  Ferritin goal 50-100.  Phlebotomy today.  Discuss One Blood donation.  Discuss plans for [facility] surveilllance (RUQ ultrasound and AFP) every 6 months.  Discuss plans for holding phlebotomy one month prior to planned hip replacement as anticipate blood loss with surgery. 3.   Health maintenance  Encourage yearly mammogram. 4.   RTC monthly x 6 for labs (HCT/Hgb, ferritin), and +/- phlebotomy. 5.   Schedule RUQ ultrasound on 07/17/2019. 6.   RTC in 6 months for MD assessment, labs (CBC with diff, ferritin, AFP), review of ultrasound, and +/- phlebotomy.  I discussed the assessment and treatment plan with the patient.  The patient was provided an opportunity to ask questions and all were answered.  The  patient agreed with the plan and demonstrated an understanding of the instructions.  The patient was advised to call back if the symptoms worsen or if the condition fails to improve as anticipated.    C. Merlene Pulling, MD, PhD    05/10/2019, 8:38 PM  I, Mal Misty, am acting as a Neurosurgeon for [employer]. Merlene Pulling, MD.   I,  C. Merlene Pulling, MD, have reviewed the above documentation for accuracy and completeness, and I agree with the above.

## 2019-05-10 ENCOUNTER — Inpatient Hospital Stay (HOSPITAL_BASED_OUTPATIENT_CLINIC_OR_DEPARTMENT_OTHER): Payer: Commercial Managed Care - PPO | Admitting: Hematology and Oncology

## 2019-05-10 ENCOUNTER — Encounter: Payer: Self-pay | Admitting: Hematology and Oncology

## 2019-05-10 ENCOUNTER — Inpatient Hospital Stay: Payer: Commercial Managed Care - PPO

## 2019-05-10 ENCOUNTER — Other Ambulatory Visit: Payer: Commercial Managed Care - PPO

## 2019-05-10 NOTE — Patient Instructions (Signed)

## 2019-05-10 NOTE — Progress Notes (Signed)
Patient is to have a MRI for left hip pain soon. Her husband passed away 2019/05/05 due to liver cancer

## 2019-05-11 ENCOUNTER — Ambulatory Visit: Payer: Commercial Managed Care - PPO | Attending: Internal Medicine

## 2019-05-11 DIAGNOSIS — Z23 Encounter for immunization: Secondary | ICD-10-CM

## 2019-05-11 NOTE — Progress Notes (Signed)
   Covid-19 Vaccination Clinic  Name:  Laura Haney    MRN: [id number] DOB: September 26, 1960  05/11/2019  [name] was observed post Covid-19 immunization for 15 minutes without incident. She was provided with Vaccine Information Sheet and instruction to access the V-Safe system.   [name] was instructed to call 911 with any severe reactions post vaccine: Marland Kitchen Difficulty breathing  . Swelling of face and throat  . A fast heartbeat  . A bad rash all over body  . Dizziness and weakness   Immunizations Administered    Name Date Dose VIS Date Route   Pfizer COVID-19 Vaccine 05/11/2019 10:24 AM 0.3 mL 02/03/2019 Intramuscular   Manufacturer: [employer], [employer]   Lot: [id number]   NDC: [address]-0

## 2019-06-06 ENCOUNTER — Ambulatory Visit: Payer: Self-pay | Attending: Internal Medicine

## 2019-06-06 DIAGNOSIS — Z23 Encounter for immunization: Secondary | ICD-10-CM

## 2019-06-06 NOTE — Progress Notes (Signed)
   Covid-19 Vaccination Clinic  Name:  Laura Haney    MRN: [id number] DOB: Nov 19, 1960  06/06/2019  [name] was observed post Covid-19 immunization for 15 minutes without incident. She was provided with Vaccine Information Sheet and instruction to access the V-Safe system.   [name] was instructed to call 911 with any severe reactions post vaccine: Marland Kitchen Difficulty breathing  . Swelling of face and throat  . A fast heartbeat  . A bad rash all over body  . Dizziness and weakness   Immunizations Administered    Name Date Dose VIS Date Route   Pfizer COVID-19 Vaccine 06/06/2019 10:42 AM 0.3 mL 02/03/2019 Intramuscular   Manufacturer: [employer], [employer]   Lot: [id number]   NDC: [address]-7

## 2019-06-07 ENCOUNTER — Inpatient Hospital Stay: Payer: Self-pay

## 2019-06-07 ENCOUNTER — Other Ambulatory Visit: Payer: Self-pay

## 2019-06-07 ENCOUNTER — Inpatient Hospital Stay: Payer: Self-pay | Attending: Hematology and Oncology

## 2019-06-07 ENCOUNTER — Telehealth: Payer: Self-pay

## 2019-06-07 LAB — HEMATOCRIT: HCT: 43.6 % (ref 36.0–46.0)

## 2019-06-07 LAB — FERRITIN: Ferritin: 101 ng/mL (ref 11–307)

## 2019-06-07 LAB — HEMOGLOBIN: Hemoglobin: 15 g/dL (ref 12.0–15.0)

## 2019-06-07 NOTE — Telephone Encounter (Signed)
Left a message to inform the patient that her Ferritin today was 101 and her goal with Dr Merlene Pulling is 50-100 and today the patient is 101. I have informed the patient to give me a call baclk at the office.

## 2019-07-05 ENCOUNTER — Inpatient Hospital Stay: Payer: Self-pay

## 2019-07-05 ENCOUNTER — Inpatient Hospital Stay: Payer: Self-pay | Attending: Hematology and Oncology

## 2019-07-17 ENCOUNTER — Ambulatory Visit: Payer: Self-pay

## 2019-07-25 ENCOUNTER — Ambulatory Visit: Payer: Self-pay

## 2019-08-02 ENCOUNTER — Inpatient Hospital Stay: Payer: Self-pay

## 2019-08-02 ENCOUNTER — Telehealth: Payer: Self-pay | Admitting: *Deleted

## 2019-08-02 ENCOUNTER — Inpatient Hospital Stay: Payer: Self-pay | Attending: Hematology and Oncology

## 2019-08-02 ENCOUNTER — Other Ambulatory Visit: Payer: Self-pay | Admitting: *Deleted

## 2019-08-02 ENCOUNTER — Other Ambulatory Visit: Payer: Self-pay

## 2019-08-02 LAB — FERRITIN: Ferritin: 69 ng/mL (ref 11–307)

## 2019-08-02 LAB — HEMOGLOBIN: Hemoglobin: 14.7 g/dL (ref 12.0–15.0)

## 2019-08-02 LAB — HEMATOCRIT: HCT: 42 % (ref 36.0–46.0)

## 2019-08-02 NOTE — Telephone Encounter (Signed)
Left a VM for patient. I informed her of her Blood test results, Ferritin 69, which indicates she is within her range of 50-100. No phlebotomy is required. Hgb is 14.7. If she has any questions please call the [place].

## 2019-08-25 ENCOUNTER — Telehealth: Payer: Self-pay | Admitting: *Deleted

## 2019-08-25 NOTE — Telephone Encounter (Signed)
Patient called in and requested all of her appts. be cancelled due to not having insurance.

## 2019-08-30 ENCOUNTER — Inpatient Hospital Stay: Payer: Self-pay

## 2019-09-27 ENCOUNTER — Inpatient Hospital Stay: Payer: Self-pay

## 2019-10-24 ENCOUNTER — Other Ambulatory Visit: Payer: Commercial Managed Care - PPO

## 2019-10-25 ENCOUNTER — Ambulatory Visit: Payer: Commercial Managed Care - PPO | Admitting: Hematology and Oncology

## 2019-11-20 ENCOUNTER — Other Ambulatory Visit: Payer: Self-pay

## 2019-11-20 ENCOUNTER — Encounter: Payer: Self-pay | Admitting: *Deleted

## 2019-11-20 ENCOUNTER — Emergency Department
Admission: EM | Admit: 2019-11-20 | Discharge: 2019-11-21 | Disposition: A | Discharge status: Other Acute Inpatient Hospital | Payer: Self-pay | Attending: Emergency Medicine | Admitting: Emergency Medicine

## 2019-11-20 DIAGNOSIS — F332 Major depressive disorder, recurrent severe without psychotic features: Secondary | ICD-10-CM | POA: Insufficient documentation

## 2019-11-20 DIAGNOSIS — F1010 Alcohol abuse, uncomplicated: Secondary | ICD-10-CM | POA: Insufficient documentation

## 2019-11-20 DIAGNOSIS — F32A Depression, unspecified: Secondary | ICD-10-CM

## 2019-11-20 DIAGNOSIS — R45851 Suicidal ideations: Secondary | ICD-10-CM | POA: Insufficient documentation

## 2019-11-20 DIAGNOSIS — F17210 Nicotine dependence, cigarettes, uncomplicated: Secondary | ICD-10-CM | POA: Insufficient documentation

## 2019-11-20 LAB — COMPREHENSIVE METABOLIC PANEL
ALT: 28 U/L (ref 0–44)
AST: 29 U/L (ref 15–41)
Albumin: 4.5 g/dL (ref 3.5–5.0)
Alkaline Phosphatase: 93 U/L (ref 38–126)
Anion gap: 13 (ref 5–15)
BUN: 8 mg/dL (ref 6–20)
CO2: 22 mmol/L (ref 22–32)
Calcium: 9.2 mg/dL (ref 8.9–10.3)
Chloride: 102 mmol/L (ref 98–111)
Creatinine, Ser: 0.61 mg/dL (ref 0.44–1.00)
GFR calc Af Amer: >60 mL/min (ref >60)
GFR calc non Af Amer: >60 mL/min (ref >60)
Glucose, Bld: 99 mg/dL (ref 70–99)
Potassium: 3.8 mmol/L (ref 3.5–5.1)
Sodium: 137 mmol/L (ref 135–145)
Total Bilirubin: 1.1 mg/dL (ref 0.3–1.2)
Total Protein: 7.6 g/dL (ref 6.5–8.1)

## 2019-11-20 LAB — URINE DRUG SCREEN, QUALITATIVE (ARMC ONLY)
Amphetamines, Ur Screen: NOT DETECTED
Barbiturates, Ur Screen: NOT DETECTED
Benzodiazepine, Ur Scrn: NOT DETECTED
Cannabinoid 50 Ng, Ur ~~LOC~~: POSITIVE — AB
Cocaine Metabolite,Ur ~~LOC~~: NOT DETECTED
MDMA (Ecstasy)Ur Screen: NOT DETECTED
Methadone Scn, Ur: NOT DETECTED
Opiate, Ur Screen: NOT DETECTED
Phencyclidine (PCP) Ur S: NOT DETECTED
Tricyclic, Ur Screen: NOT DETECTED

## 2019-11-20 LAB — CBC
HCT: 43.9 % (ref 36.0–46.0)
Hemoglobin: 15.7 g/dL — ABNORMAL HIGH (ref 12.0–15.0)
MCH: 35 pg — ABNORMAL HIGH (ref 26.0–34.0)
MCHC: 35.8 g/dL (ref 30.0–36.0)
MCV: 98 fL (ref 80.0–100.0)
Platelets: 216 10*3/uL (ref 150–400)
RBC: 4.48 MIL/uL (ref 3.87–5.11)
RDW: 14.1 % (ref 11.5–15.5)
WBC: 8.2 10*3/uL (ref 4.0–10.5)
nRBC: 0 % (ref 0.0–0.2)

## 2019-11-20 LAB — ETHANOL: Alcohol, Ethyl (B): 240 mg/dL — ABNORMAL HIGH (ref <10)

## 2019-11-20 NOTE — ED Triage Notes (Signed)
Pt brought in via ems from home with depression.  Pt crying on arrival.  Pt admits to drinking beer today.  Denies Si or HI.  Denies drug use today.  Pt reports her husband died 04-26-22.

## 2019-11-20 NOTE — ED Notes (Signed)
Pt. Alert and oriented, warm and dry, in no distress. Pt. Denies HI, and AVH. Pt does not say she is SI but does say she wish she was dead. Patient tearful. Patient states she drinks everyday and has 8 beers a day. Per patient she is bipolar and has been out of her medications. Pt. Encouraged to let nursing staff know of any concerns or needs.

## 2019-11-20 NOTE — ED Notes (Signed)
Black sandals Wallace Cullens dress [employer] Tan panties

## 2019-11-20 NOTE — ED Provider Notes (Signed)
[facility] Emergency Department Provider Note   ____________________________________________   First MD Initiated Contact with Patient 11/20/19 2004     (approximate)  I have reviewed the triage vital signs and the nursing notes.   HISTORY  Chief Complaint Depression    HPI Laura Haney is a 59 y.o. female with a stated past medical history of bipolar disorder and depression who presents for worsening depressive symptoms.  Patient states that she has had multiple "panic attacks" over the past few days as well as insomnia that have worsened her depression.  Patient currently denies any suicidal ideation, homicidal ideation, auditory/visual hallucinations.  Patient does endorse alcohol abuse and endorses drinking 8 beers prior to arrival         Past Medical History:  Diagnosis Date  . Bipolar 2 disorder (HCC)   . Depression   . Iron excess   . Left hip pain     Patient Active Problem List   Diagnosis Date Noted  . Hereditary hemochromatosis (HCC) 02/15/2019  . Alcohol abuse 05/16/2018  . Suicidal ideation 05/13/2018  . Bipolar 2 disorder (HCC) 01/24/2018  . Primary osteoarthritis of left hip 01/24/2018    No past surgical history on file.  Prior to Admission medications   Medication Sig Start Date End Date Taking? Authorizing Provider  cholecalciferol (VITAMIN D3) 25 MCG (1000 UT) tablet Take 1,000 Units by mouth daily. Patient not taking: Reported on 11/20/2019    [provider]  DULoxetine (CYMBALTA) 60 MG capsule Take 1 capsule (60 mg total) by mouth daily. Patient not taking: Reported on 11/20/2019 05/18/18   [name], Jackquline Denmark, MD  ibuprofen (ADVIL) 800 MG tablet Take 800 mg by mouth every 6 (six) hours as needed.  Patient not taking: Reported on 11/20/2019 01/09/19   [provider]  lamoTRIgine (LAMICTAL) 150 MG tablet Take 1 tablet (150 mg total) by mouth at bedtime. Patient not taking: Reported on 11/20/2019 05/17/18    [name], Jackquline Denmark, MD  QUEtiapine (SEROQUEL) 200 MG tablet Take 1 tablet (200 mg total) by mouth at bedtime. Patient not taking: Reported on 11/20/2019 05/17/18   [name], Jackquline Denmark, MD    Allergies Hydrocodone and Penicillins  Family History  Problem Relation Age of Onset  . Heart disease Mother   . Cancer Brother        pancreatic  . Heart attack Paternal Grandfather     Social History Social History   Tobacco Use  . Smoking status: Current Every Day Smoker    Packs/day: 1.50    Years: 40.00    Pack years: 60.00    Types: Cigarettes  . Smokeless tobacco: Never Used  Vaping Use  . Vaping Use: Never used  Substance Use Topics  . Alcohol use: Not Currently  . Drug use: Not Currently    Comment: occasionally    Review of Systems Constitutional: No fever/chills Eyes: No visual changes. ENT: No sore throat. Cardiovascular: Denies chest pain. Respiratory: Denies shortness of breath. Gastrointestinal: No abdominal pain.  No nausea, no vomiting.  No diarrhea. Genitourinary: Negative for dysuria. Musculoskeletal: Negative for acute arthralgias Skin: Negative for rash. Neurological: Negative for headaches, weakness/numbness/paresthesias in any extremity Psychiatric: Endorses depression and panic attacks.  Negative for suicidal ideation/homicidal ideation   ____________________________________________   PHYSICAL EXAM:  VITAL SIGNS: ED Triage Vitals  Enc Vitals Group     BP 11/20/19 1920 111/82     Pulse Rate 11/20/19 1920 75     Resp 11/20/19 1920 (!)  22     Temp 11/20/19 1920 98 F (36.7 C)     Temp Source 11/20/19 1920 Oral     SpO2 11/20/19 1920 96 %     Weight 11/20/19 1921 175 lb (79.4 kg)     Height 11/20/19 1921 5\' 8"  (1.727 m)     Head Circumference --      Peak Flow --      Pain Score 11/20/19 1921 0     Pain Loc --      Pain Edu? --      Excl. in GC? --    Constitutional: Alert and oriented. Well appearing.  Tearful Eyes: Conjunctivae are normal.  PERRL. Head: Atraumatic. Nose: No congestion/rhinnorhea. Mouth/Throat: Mucous membranes are moist. Neck: No stridor Cardiovascular: Grossly normal heart sounds.  Good peripheral circulation. Respiratory: Normal respiratory effort.  No retractions. Gastrointestinal: Soft and nontender. No distention. Musculoskeletal: No obvious deformities Neurologic:  Normal speech and language. No gross focal neurologic deficits are appreciated. Skin:  Skin is warm and dry. No rash noted. Psychiatric: Tearful.  Mood and affect are depressed. Speech and behavior are normal.  ____________________________________________   LABS (all labs ordered are listed, but only abnormal results are displayed)  Labs Reviewed  ETHANOL - Abnormal; Notable for the following components:      Result Value   Alcohol, Ethyl (B) 240 (*)    All other components within normal limits  CBC - Abnormal; Notable for the following components:   Hemoglobin 15.7 (*)    MCH 35.0 (*)    All other components within normal limits  URINE DRUG SCREEN, QUALITATIVE ([facility] ONLY) - Abnormal; Notable for the following components:   Cannabinoid 50 Ng, Ur Williams POSITIVE (*)    All other components within normal limits  COMPREHENSIVE METABOLIC PANEL   __  PROCEDURES  Procedure(s) performed (including Critical Care):  Procedures   ____________________________________________   INITIAL IMPRESSION / ASSESSMENT AND PLAN / ED COURSE  As part of my medical decision making, I reviewed the following data within the electronic MEDICAL RECORD NUMBER Nursing notes reviewed and incorporated, Labs reviewed, EKG interpreted, Old chart reviewed, and Notes from prior ED visits reviewed and incorporated        Thoughts are linear and organized, and patient has no SI, AH, VH, or HI. Prior suicide attempt: Denies Prior Psychiatric Hospitalizations: Multiple  Clinically patient displays no overt toxidrome; they are well appearing, with low suspicion for  toxic ingestion given history and exam. Thoughts unlikely 2/2 anemia, hypothyroidism, infection, or ICH.  Consult: Psychiatry to evaluate patient for potential hold for danger to self. Disposition: Care of this patient will be signed out to the oncoming physician.  All pertinent patient formation conveyed and all questions answered.  All further care and disposition decisions will be made by the oncoming physician.       ____________________________________________   FINAL CLINICAL IMPRESSION(S) / ED DIAGNOSES  Final diagnoses:  Depression, unspecified depression type  Alcohol abuse     ED Discharge Orders    None       Note:  This document was prepared using Dragon voice recognition software and may include unintentional dictation errors.   11/22/19, MD 11/20/19 2242

## 2019-11-21 MED ORDER — LORAZEPAM 1 MG PO TABS
1.0000 mg | ORAL_TABLET | Freq: Once | ORAL | Status: AC
  Administered 2019-11-21: 1 mg via ORAL
  Filled 2019-11-21: qty 1

## 2019-11-21 NOTE — BH Assessment (Signed)
Referral information for Psychiatric Hospitalization faxed to;    [facility] ([phone]- [phone]),    [facility] [phone]),   [address] [phone]),    [facility] [phone] -or- [phone]),    [facility] [phone]),    [facility] [phone]).   [facility] [phone]) Per Leeann Must, pt will be reviewed by oncoming AC.

## 2019-11-21 NOTE — ED Notes (Signed)
Gave lunch tray with juice. 

## 2019-11-21 NOTE — ED Notes (Signed)
Received a call from OV Shaletha  Update provided  Questions answered

## 2019-11-21 NOTE — ED Notes (Signed)
Pt given breakfast tray

## 2019-11-21 NOTE — ED Notes (Signed)
Pt ambulatory to bathroom

## 2019-11-21 NOTE — ED Notes (Signed)
Pt ambulatory to the bathroom 
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
## 2019-11-21 NOTE — ED Notes (Signed)
Phone number provided in TTS note is no good  [employer] called but they report that the nurse is busy and I must call back

## 2019-11-21 NOTE — BH Assessment (Addendum)
Pt can arrive after 12pm     Patient has been accepted to [facility]  Patient assigned to: [facility] Accepting physician is Dr. Forrestine Him Call report to [phone] Representative was [facility].    ER Staff is aware of it:  [facility] ER Secretary  Larinda Buttery, ER MD  Amy T. Patient's Nurse  Patient's Family/Support System Laquanna Veazey, [phone]) was contacted and left a HIPPA compliant voicemail.

## 2019-11-21 NOTE — ED Notes (Signed)
SAFE  TRANSPORT  CALLED  FOR  PT  GOING  TO  OLD  [facility]

## 2020-03-26 HISTORY — PX: MULTIPLE TOOTH EXTRACTIONS: SHX2053

## 2020-03-31 IMAGING — US US ABDOMEN LIMITED W/ ELASTOGRAPHY
1 series · 12 of 25 positions shown · non-contrast
Comparison: None.

CLINICAL DATA: Hereditary hemochromatosis

EXAM:
US ABDOMEN LIMITED - RIGHT UPPER QUADRANT
ULTRASOUND HEPATIC ELASTOGRAPHY
TECHNIQUE: Sonography of the right upper quadrant was performed. In addition,
ultrasound elastography evaluation of the liver was performed. A
region of interest was placed within the right lobe of the liver.
Following application of a compressive sonographic pulse, tissue
compressibility was assessed. Multiple assessments were performed at
the selected site. Median tissue compressibility was determined.
Previously, hepatic stiffness was assessed by shear wave velocity.
Based on recently published Society of Radiologists in Ultrasound
consensus article, reporting is now recommended to be performed in
the SI units of pressure (kiloPascals) representing hepatic
stiffness/elasticity. The obtained result is compared to the
published reference standards. (cACLD= compensated Advanced Chronic
Liver Disease)

[Series 2: us abdomen limited w/ elastography · 12 of 87 slices shown]
[im 4/87]
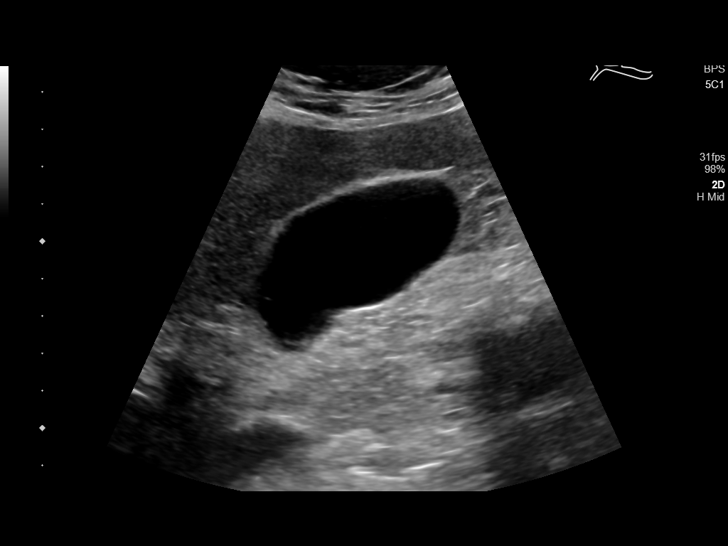
[im 11/87]
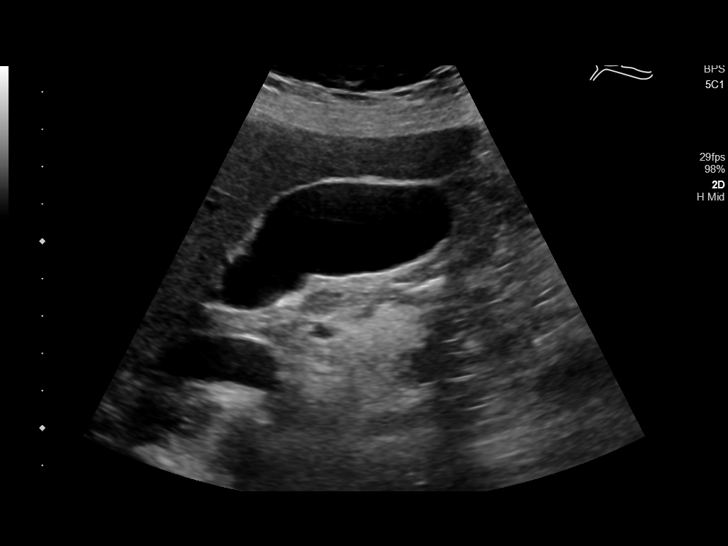
[im 18/87]
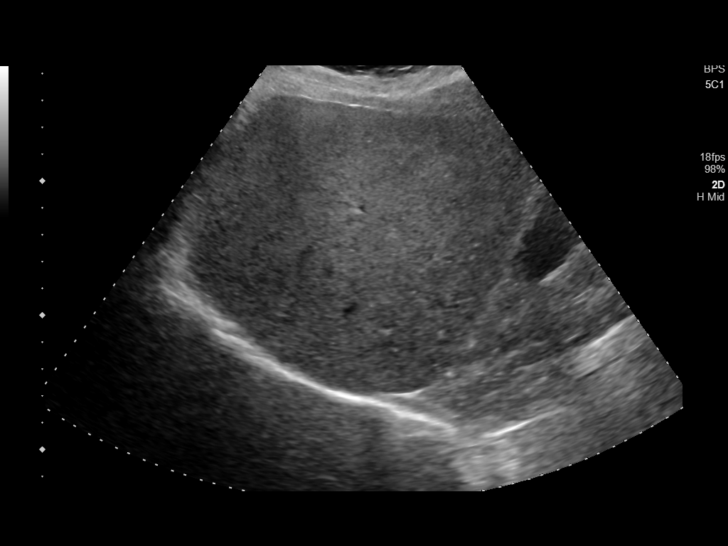
[im 26/87]
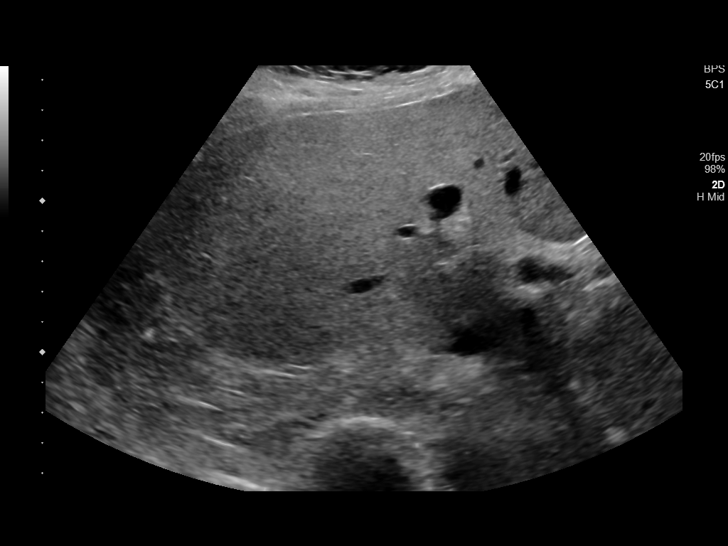
[im 33/87]
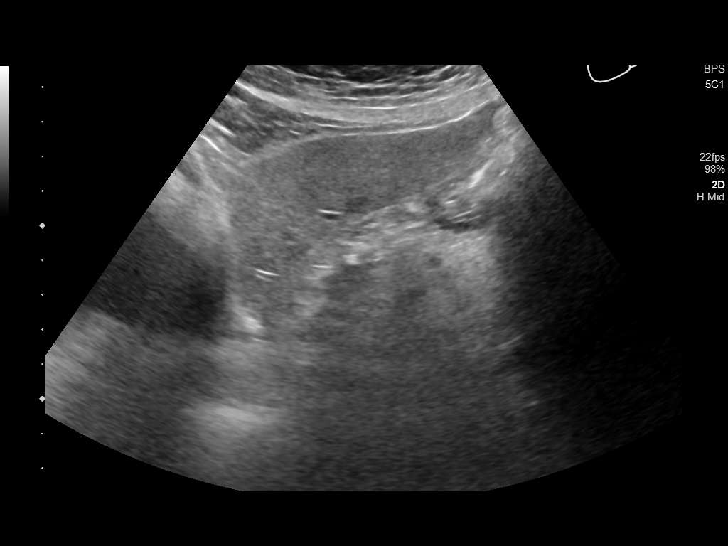
[im 40/87]
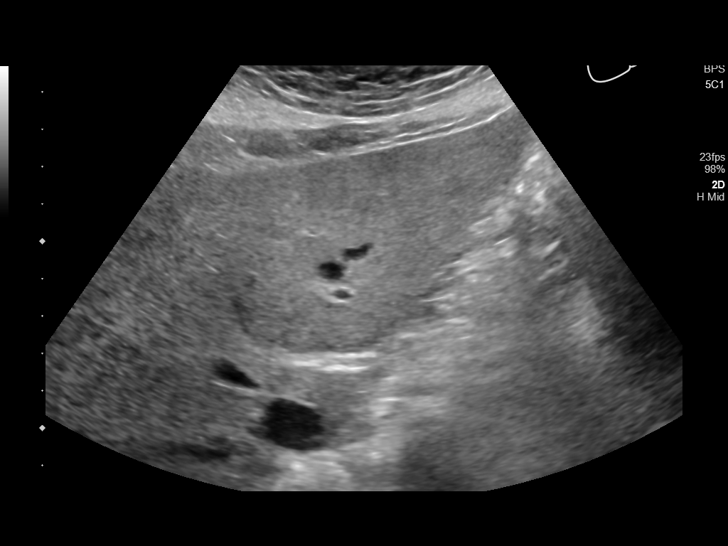
[im 47/87]
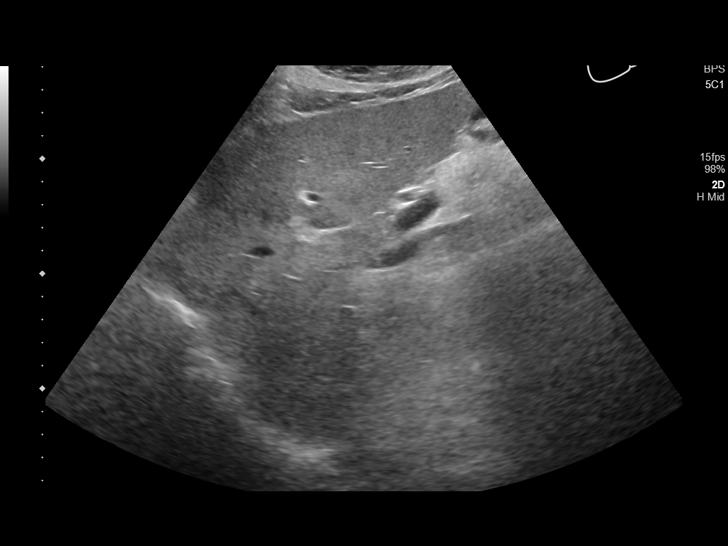
[im 54/87]
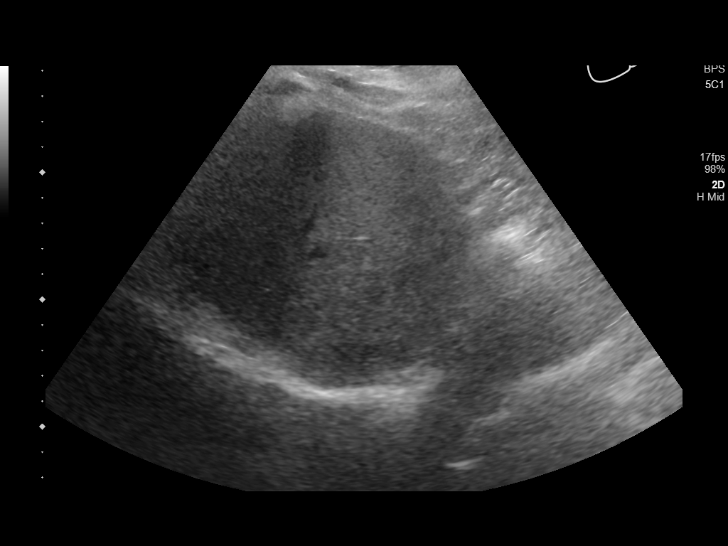
[im 61/87]
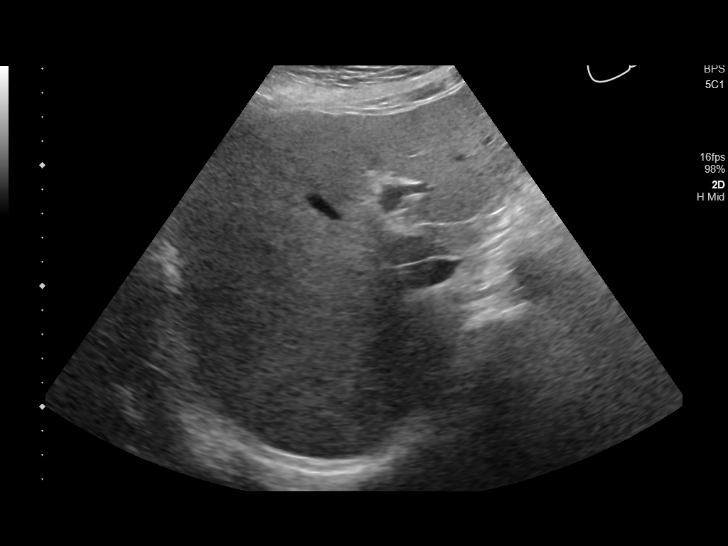
[im 69/87]
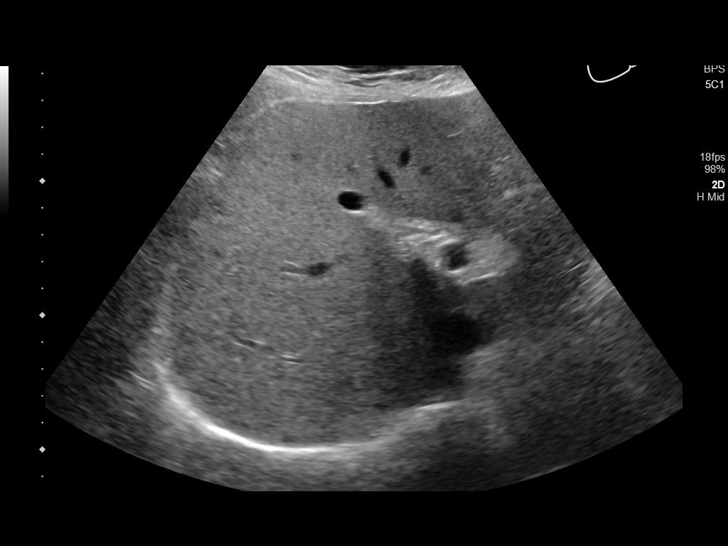
[im 76/87]
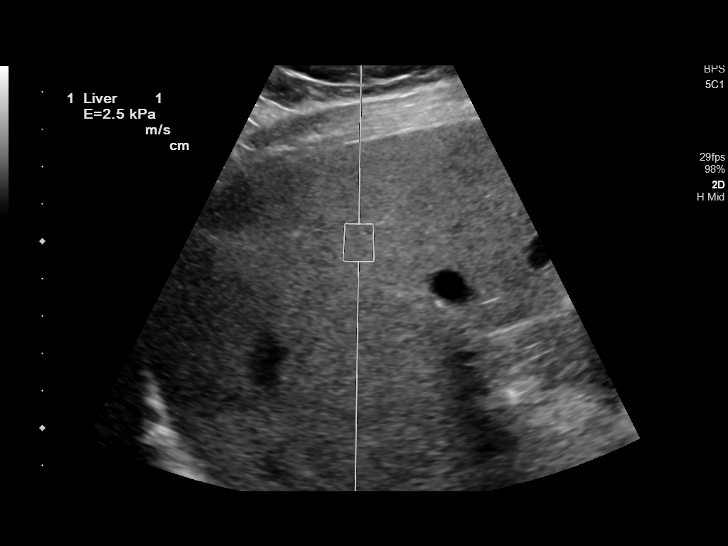
[im 83/87]
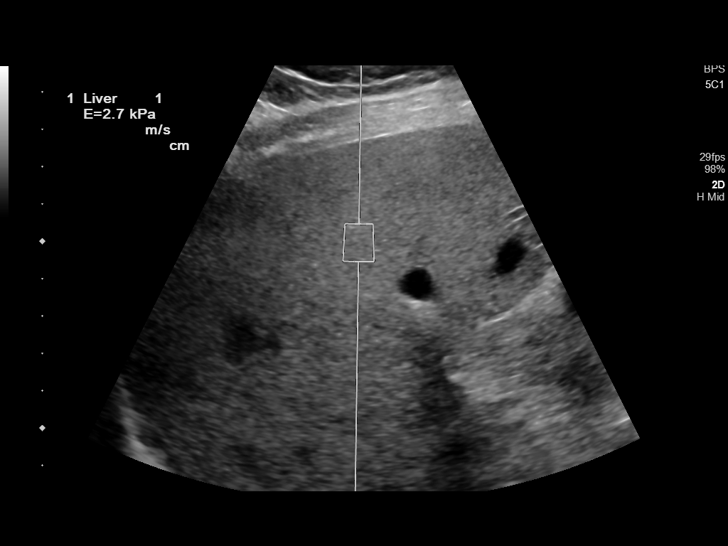

[12 of 25 positions shown; findings below may reference images not displayed]

FINDINGS: ULTRASOUND ABDOMEN LIMITED RIGHT UPPER QUADRANT

Gallbladder:

No gallstones, gallbladder wall thickening, or pericholecystic
fluid. Negative sonographic Murphy sign.

Common bile duct:

Diameter: 3 mm

Liver:

7 mm cyst in the left hepatic lobe, benign. Within normal limits in
parenchymal echogenicity. Portal vein is patent on color Doppler
imaging with normal direction of blood flow towards the liver.

ULTRASOUND HEPATIC ELASTOGRAPHY

Device: Siemens Helix VTQ

Patient position: Supine

Transducer 5C1

Number of measurements: 10

Hepatic segment:  8

Median kPa:

IQR:

IQR/Median kPa ratio:

Data quality:  Good

Diagnostic category:  ?5 kPa: high probability of being normal
IMPRESSION: ULTRASOUND RUQ:

7 mm left hepatic lobe cyst, benign.

Otherwise negative right upper quadrant ultrasound.

ULTRASOUND HEPATIC ELASTOGRAPHY:

Median kPa:

Diagnostic category:  ?5 kPa: high probability of being normal

The use of hepatic elastography is applicable to patients with viral
hepatitis and non-alcoholic fatty liver disease. At this time, there
is insufficient data for the referenced cut-off values and use in
other causes of liver disease, including alcoholic liver disease.
Patients, however, may be assessed by elastography and serve as
their own reference standard/baseline.

In patients with non-alcoholic liver disease, the values suggesting
compensated advanced chronic liver disease (cACLD) may be lower, and
patients may need additional testing with elasticity results of [DATE]
kPa.

Please note that abnormal hepatic elasticity and shear wave
velocities may also be identified in clinical settings other than
with hepatic fibrosis, such as: acute hepatitis, elevated right
heart and central venous pressures including use of beta blockers,
Ceola disease (Giorgi), infiltrative processes such as
mastocytosis/amyloidosis/infiltrative tumor/lymphoma, extrahepatic
cholestasis, with hyperemia in the post-prandial state, and with
liver transplantation. Correlation with patient history, laboratory
data, and clinical condition recommended.

## 2020-04-08 NOTE — Progress Notes (Signed)
[facility]  [address], [address] [place], [place] [address] Phone: [phone]  Fax: [phone]   Clinic Day:  04/09/20  Referring physician: Myrene Buddy, *  Chief Complaint: Laura Haney is a 60 y.o. female with hereditary hemochromatosis who is seen for surgical clearance.  HPI:  The patient was last seen in the hematology clinic on 05/10/2019. At that time,  she noted left hip pain.  She was planning on a hip replacement.  Exam was stable. Hematocrit was 41.1, hemoglobin 14.3, platelets 162,000, WBC 7,200. Calcium was 8.6.  Ferritin was 112. She underwent therapeutic phlebotomy. We discussed consideration for holding phlebotomy one month prior to planned hip replacement secondary to anticipated blood loss with surgery.  Follow-up was planned for 6 months; she cancelled her appointments because of lack of insurance.   The patient established care with Mal Misty, PA (orthopedics) on 06/22/2019. She reported left hip pain x 6 years that began without injury. The patient did not want to try conservative treatment, she preferred a left total hip arthroplasty. They discussed that she would need to stop smoking and drinking alcohol as well as obtain clearance from hematology. She declined a referral to the smoking cessation clinic.   The patient saw Lenon Oms, NP on 03/19/2020. Hematocrit was 39.6, hemoglobin 14.0, platelets 173,000, WBC 6,700. Ferritin was 95. The patient had been seen by [facility] and was planning to move forward with surgery in 6-8 weeks.  Labs followed: 06/07/2019: Hematocrit 43.6, hemoglobin 15.0. Ferritin 101. 08/02/2019: Hematocrit 42.0, hemoglobin 14.7. Ferritin   69. 11/20/2019: Hematocrit 43.9, hemoglobin 15.7.  [facility] labs on 03/19/2020 revealed a hematocrit was 39.6, hemoglobin 14.0, platelets 173,000, WBC 6,700. CMP was normal except for a CO2 of 33.6. Ferritin was 95. AFP was 2.2.  During the interim, she has  felt good. She had teeth extracted on 03/28/2020. She has not had any phlebotomies. She stopped drinking alcohol. She was drinking 12-15 beers per day. She has not had any alcohol in 135 days.  The patient has hip pain that is affecting her daily life. She is not able to exercise. She does not exert herself often, but when she does, she gets winded.  She is paying for her hip replacement out of pocket with money that her husband left for her when he passed away. Surgery is scheduled for 05/29/2020. She has been struggling with insurance problems over the past year.  She is post-menopausal. Her last colonoscopy and mammogram were around 2015.  She denies any bleeding.   Past Medical History:  Diagnosis Date  . Bipolar 2 disorder (HCC)   . Depression   . Iron excess   . Left hip pain     History reviewed. No pertinent surgical history.  Family History  Problem Relation Age of Onset  . Heart disease Mother   . Cancer Brother        pancreatic  . Heart attack Paternal Grandfather   One of her brothers was tested for hepatitis B; he was diagnosed with hemochromatosis. He later passed secondary to having pancreatic cancer and hepatitis B. Her other brother and mother do no have hemochromatosis.  She is unaware if her father had it because he passed when she was 60 years old.    Social History:  reports that she has been smoking cigarettes. She has a 60.00 pack-year smoking history. She has never used smokeless tobacco. She reports previous alcohol use. She reports previous drug use.   She  smokes.  She reported being diagnosed with alcoholism. She has been an alcoholic for 25 years. She used to drink 12-15 beers daily. She has not had a drink in 135 days. Two of her children live with her.  She used to live in [place], [place]. She reports being sober for x 3 years before [place] years ago. Her mother-in-law lives with them. Her husband, Ethelene Brownsnthony, died on  04/09/2019 with liver cancer.  Patient has had 3 pregnancies. She has 2 sons (ages 4121 and 7024). Only one son has been tested for hemochromatosis. 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   Allergies:  Allergies  Allergen Reactions  . Hydrocodone Other (See Comments)    Makes patient angry  . Penicillins Other (See Comments)    Bad yeast infections    Current Medications: Current Outpatient Medications  Medication Sig Dispense Refill  . chlorhexidine (PERIDEX) 0.12 % solution SMARTSIG:5-10 Milliliter(s) By Mouth Daily    . cholecalciferol (VITAMIN D3) 25 MCG (1000 UT) tablet Take 1,000 Units by mouth daily.    . DULoxetine (CYMBALTA) 60 MG capsule Take 1 capsule (60 mg total) by mouth daily. 30 capsule 1  . hydrOXYzine (VISTARIL) 25 MG capsule     . ibuprofen (ADVIL) 200 MG tablet Take 200 mg by mouth every 6 (six) hours as needed.    . lamoTRIgine (LAMICTAL) 150 MG tablet Take 1 tablet (150 mg total) by mouth at bedtime. 30 tablet 1  . Multiple Vitamin (MULTI-VITAMIN) tablet Take 1 tablet by mouth daily.    . naltrexone (DEPADE) 50 MG tablet     . QUEtiapine (SEROQUEL) 200 MG tablet Take 1 tablet (200 mg total) by mouth at bedtime. 30 tablet 1   No current facility-administered medications for this visit.    Review of Systems  Constitutional: Negative for chills, diaphoresis, fever, malaise/fatigue and weight loss (up 3 lbs).  HENT: Negative for congestion, ear discharge, ear pain, hearing loss, nosebleeds, sinus pain, sore throat and tinnitus.        S/p teeth extraction on 03/28/2020.  Eyes: Negative.  Negative for blurred vision, double vision and photophobia.  Respiratory: Positive for shortness of breath (on exertion). Negative for cough, hemoptysis and sputum production.   Cardiovascular: Negative.  Negative for chest pain, palpitations, orthopnea, claudication and leg swelling.  Gastrointestinal: Negative.  Negative for abdominal pain,  blood in stool, constipation, diarrhea, heartburn, melena, nausea and vomiting.  Genitourinary: Negative.  Negative for dysuria, frequency, hematuria and urgency.  Musculoskeletal: Positive for joint pain (hip). Negative for back pain, myalgias and neck pain.  Skin: Negative.  Negative for itching and rash.  Neurological: Negative.  Negative for dizziness, tingling, sensory change, speech change, focal weakness, weakness and headaches.  Endo/Heme/Allergies: Negative.  Does not bruise/bleed easily.  Psychiatric/Behavioral: Negative for depression and memory loss. The patient is not nervous/anxious and does not have insomnia.        Bipolar depression.  All other systems reviewed and are negative.  Performance status (ECOG): 1  Vitals Blood pressure 105/64, pulse 82, temperature 98 F (36.7 C), temperature source Tympanic, resp. rate 16, weight 179 lb 9 oz (81.4 kg), SpO2 99 %.   Physical Exam Vitals and nursing note reviewed.  Constitutional:      General: She is not in acute distress.    Appearance: She is well-developed. She is not diaphoretic.  HENT:     Head:  Normocephalic and atraumatic.     Comments: Curly white hair.    Mouth/Throat:     Mouth: Mucous membranes are moist.     Pharynx: Oropharynx is clear. No oropharyngeal exudate.  Eyes:     General: No scleral icterus.    Extraocular Movements: Extraocular movements intact.     Conjunctiva/sclera: Conjunctivae normal.     Pupils: Pupils are equal, round, and reactive to light.     Comments: Blue eyes.  Neck:     Vascular: No JVD.  Cardiovascular:     Rate and Rhythm: Normal rate and regular rhythm.     Heart sounds: Normal heart sounds. No murmur heard. No gallop.   Pulmonary:     Effort: Pulmonary effort is normal. No respiratory distress.     Breath sounds: Normal breath sounds. No wheezing or rales.  Chest:     Chest wall: No tenderness.  Breasts:     Right: No axillary adenopathy or supraclavicular adenopathy.      Left: No axillary adenopathy or supraclavicular adenopathy.    Abdominal:     General: Bowel sounds are normal. There is no distension.     Palpations: Abdomen is soft. There is no mass.     Tenderness: There is no abdominal tenderness. There is no guarding or rebound.  Musculoskeletal:        General: No tenderness. Normal range of motion.     Cervical back: Normal range of motion and neck supple.  Lymphadenopathy:     Head:     Right side of head: No preauricular, posterior auricular or occipital adenopathy.     Left side of head: No preauricular, posterior auricular or occipital adenopathy.     Cervical: No cervical adenopathy.     Upper Body:     Right upper body: No supraclavicular or axillary adenopathy.     Left upper body: No supraclavicular or axillary adenopathy.     Lower Body: No right inguinal adenopathy. No left inguinal adenopathy.  Skin:    General: Skin is warm and dry.     Findings: No erythema or rash.  Neurological:     Mental Status: She is oriented to person, place, and time.  Psychiatric:        Behavior: Behavior normal.        Thought Content: Thought content normal.        Judgment: Judgment normal.    No visits with results within 3 Day(s) from this visit.  Latest known visit with results is:  Admission on 11/20/2019, Discharged on 11/21/2019  Component Date Value Ref Range Status  . Sodium 11/20/2019 137  135 - 145 mmol/L Final  . Potassium 11/20/2019 3.8  3.5 - 5.1 mmol/L Final  . Chloride 11/20/2019 102  98 - 111 mmol/L Final  . CO2 11/20/2019 22  22 - 32 mmol/L Final  . Glucose, Bld 11/20/2019 99  70 - 99 mg/dL Final   Glucose reference range applies only to samples taken after fasting for at least 8 hours.  . BUN 11/20/2019 8  6 - 20 mg/dL Final  . Creatinine, Ser 11/20/2019 0.61  0.44 - 1.00 mg/dL Final  . Calcium 16/11/9602 9.2  8.9 - 10.3 mg/dL Final  . Total Protein 11/20/2019 7.6  6.5 - 8.1 g/dL Final  . Albumin 54/10/8117 4.5  3.5  - 5.0 g/dL Final  . AST 14/78/2956 29  15 - 41 U/L Final  . ALT 11/20/2019 28  0 - 44 U/L Final  .  Alkaline Phosphatase 11/20/2019 93  38 - 126 U/L Final  . Total Bilirubin 11/20/2019 1.1  0.3 - 1.2 mg/dL Final  . GFR calc non Af Amer 11/20/2019 >60  >60 mL/min Final  . GFR calc Af Amer 11/20/2019 >60  >60 mL/min Final  . Anion gap 11/20/2019 13  5 - 15 Final   Performed at [facility], [address] [place], [place] [address]  . Alcohol, Ethyl (B) 11/20/2019 240* <10 mg/dL Final   Comment: (NOTE) Lowest detectable limit for serum alcohol is 10 mg/dL.  For medical purposes only. Performed at [facility], [address], [place], [place] [address]   . WBC 11/20/2019 8.2  4.0 - 10.5 K/uL Final  . RBC 11/20/2019 4.48  3.87 - 5.11 MIL/uL Final  . Hemoglobin 11/20/2019 15.7* 12.0 - 15.0 g/dL Final  . HCT 21/19/4174 43.9  36.0 - 46.0 % Final  . MCV 11/20/2019 98.0  80.0 - 100.0 fL Final  . MCH 11/20/2019 35.0* 26.0 - 34.0 pg Final  . MCHC 11/20/2019 35.8  30.0 - 36.0 g/dL Final  . RDW 10/06/4816 14.1  11.5 - 15.5 % Final  . Platelets 11/20/2019 216  150 - 400 K/uL Final  . nRBC 11/20/2019 0.0  0.0 - 0.2 % Final   Performed at [facility], [address], [place], [place] [address]  . Tricyclic, Ur Screen 11/20/2019 NONE DETECTED  NONE DETECTED Final  . Amphetamines, Ur Screen 11/20/2019 NONE DETECTED  NONE DETECTED Final  . MDMA (Ecstasy)Ur Screen 11/20/2019 NONE DETECTED  NONE DETECTED Final  . Cocaine Metabolite,Ur [place] 11/20/2019 NONE DETECTED  NONE DETECTED Final  . Opiate, Ur Screen 11/20/2019 NONE DETECTED  NONE DETECTED Final  . Phencyclidine (PCP) Ur S 11/20/2019 NONE DETECTED  NONE DETECTED Final  . Cannabinoid 50 Ng, Ur  11/20/2019 POSITIVE* NONE DETECTED Final  . Barbiturates, Ur Screen 11/20/2019 NONE DETECTED  NONE DETECTED Final  . Benzodiazepine, Ur Scrn 11/20/2019 NONE DETECTED  NONE DETECTED Final  . Methadone Scn, Ur 11/20/2019 NONE  DETECTED  NONE DETECTED Final   Comment: (NOTE) Tricyclics + metabolites, urine    Cutoff 1000 ng/mL Amphetamines + metabolites, urine  Cutoff 1000 ng/mL MDMA (Ecstasy), urine              Cutoff 500 ng/mL Cocaine Metabolite, urine          Cutoff 300 ng/mL Opiate + metabolites, urine        Cutoff 300 ng/mL Phencyclidine (PCP), urine         Cutoff 25 ng/mL Cannabinoid, urine                 Cutoff 50 ng/mL Barbiturates + metabolites, urine  Cutoff 200 ng/mL Benzodiazepine, urine              Cutoff 200 ng/mL Methadone, urine                   Cutoff 300 ng/mL  The urine drug screen provides only a preliminary, unconfirmed analytical test result and should not be used for non-medical purposes. Clinical consideration and professional judgment should be applied to any positive drug screen result due to possible interfering substances. A more specific alternate chemical method must be used in order to obtain a confirmed analytical result. Gas chromatography / mass spectrometry (GC/MS) is the preferred confirm  atory method. Performed at [facility], [address] [place], [place] [address]     Assessment:  Laura Haney is a 60 y.o. female with hereditary hemochromatosis.  She was diagnosed 10 years ago in [place], [place] (no records available).  She has been on a phlebotomy program.  Ferritin goal was < 50.  Last phlebotomy was 05/10/2019.  Ferritin has been followed:  116 on 02/15/2019, 85 on 03/15/2019, 115 on 04/17/2019, 112 on 05/09/2019, 101 on 06/07/2019, 69 on 08/02/2019, and 95 on 03/19/2020.  RUQ abdominal ultrasound with elastography on 01/17/2019 revealed a 7 mm left hepatic lobe cyst, benign.  AFP has been followed:  2.8 on 02/15/2019 and 2.2 on 03/19/2020.  She has a history of alcoholism.  She has been sober x 1 month.  Hepatitis C testing was negative on 01/09/2019.  She has received the hepatitis B vaccine series.   The patient  received the Pfizer COVID-19 vaccine on 05/11/2019 and 06/06/2019. She received the Medtronic on 02/07/2020.  Symptomatically, she has hip pain affecting quality of life.  She has not had an interval phlebotomy.  She denies any bleeding.  Plan: 1.   Review labs from 03/19/2020. 2.   Hereditary hemochromatosis  Clinically, he appears to be doing well.  Hematocrit 39.6.  Hemoglobin 14.0.  MCV 94.1.    Ferritin was 95.  Ferritin goal 50-100.  Discuss holding off on phlebotomy secondary to upcoming surgery and likely loss of blood.  Review consideration of [employer] donation when phlebotomy needed.  Continue HCC surveillance every 6 months (RUQ ultrasound and AFP).  Continue follow-up every 6 months for labs (CBC, ferritin) with Lenon Oms, NP.  Continue phlebotomy as needed to maintain ferritin goal. 3.   Health maintenance  Encourage yearly mammogram.  Discuss consideration of GI evaluation secondary to no interval increase in ferritin despite hemochromatosis.   Guaiac cards x 3. 4.   RN:  Provide patient with information on One Blood. 5.   Patient cleared for hip surgery from a hematology perspective. 6.   RTC in 1 year for MD assessment, labs (CBC with diff, CMP, AFP), and review interval liver ultrasound.  I discussed the assessment and treatment plan with the patient.  The patient was provided an opportunity to ask questions and all were answered.  The patient agreed with the plan and demonstrated an understanding of the instructions.  The patient was advised to call back if the symptoms worsen or if the condition fails to improve as anticipated.  I provided 24 minutes of face-to-face time during this this encounter and > 50% was spent counseling as documented under my assessment and plan.  An additional 10+ minutes were spent reviewing her chart (Epic and Care Everywhere) including notes, labs, and imaging studies.    Rosey Bath, MD, PhD 04/09/2020, 5:00 PM  I, Danella Penton Tufford, am acting as a Neurosurgeon for [employer]. Merlene Pulling, MD.   I, Kollin Udell C. Merlene Pulling, MD, have reviewed the above documentation for accuracy and completeness, and I agree with the above.

## 2020-04-09 ENCOUNTER — Encounter: Payer: Self-pay | Admitting: Hematology and Oncology

## 2020-04-09 ENCOUNTER — Other Ambulatory Visit: Payer: Self-pay | Admitting: Hematology and Oncology

## 2020-04-09 ENCOUNTER — Inpatient Hospital Stay: Payer: Self-pay | Attending: Hematology and Oncology | Admitting: Hematology and Oncology

## 2020-04-09 ENCOUNTER — Other Ambulatory Visit: Payer: Self-pay

## 2020-04-09 DIAGNOSIS — F17210 Nicotine dependence, cigarettes, uncomplicated: Secondary | ICD-10-CM | POA: Insufficient documentation

## 2020-04-09 NOTE — Progress Notes (Signed)
135 days sober and reduced cig smoking to 1/2 pack day. Patient had labs done on 03/19/20 at PCP office, results in care everywhere. Patient just had teeth pulled and dentures in on 03/28/20, she is still sore.

## 2020-04-09 NOTE — Patient Instructions (Signed)
Patient cleared for surgery .

## 2020-04-11 ENCOUNTER — Ambulatory Visit: Payer: Self-pay | Admitting: Hematology and Oncology

## 2020-04-23 ENCOUNTER — Telehealth: Payer: Self-pay | Admitting: *Deleted

## 2020-04-23 ENCOUNTER — Telehealth: Payer: Self-pay

## 2020-04-23 NOTE — Telephone Encounter (Signed)
   All clearance forms that we have received have been signed.  Please request another form to be FAXed to [place].  M

## 2020-04-23 NOTE — Telephone Encounter (Signed)
Toniann Fail from [facility] called stating that they have faxed over a clearance form for this patient several times and have not received it back as of yet. She requests a return call

## 2020-04-24 NOTE — Telephone Encounter (Signed)
Call was returned to [place] requesting form to be refaxed to [place]. She agreed to resend this

## 2020-05-27 HISTORY — PX: TOTAL HIP ARTHROPLASTY: SHX124

## 2021-04-02 ENCOUNTER — Other Ambulatory Visit: Payer: Self-pay | Admitting: *Deleted

## 2021-04-08 ENCOUNTER — Inpatient Hospital Stay (HOSPITAL_BASED_OUTPATIENT_CLINIC_OR_DEPARTMENT_OTHER): Payer: Self-pay | Admitting: Oncology

## 2021-04-08 ENCOUNTER — Encounter: Payer: Self-pay | Admitting: Oncology

## 2021-04-08 ENCOUNTER — Inpatient Hospital Stay: Payer: Medicaid Other | Attending: Oncology

## 2021-04-08 ENCOUNTER — Other Ambulatory Visit: Payer: Self-pay

## 2021-04-08 DIAGNOSIS — Z800 Family history of malignant neoplasm of digestive organs: Secondary | ICD-10-CM | POA: Insufficient documentation

## 2021-04-08 DIAGNOSIS — F17210 Nicotine dependence, cigarettes, uncomplicated: Secondary | ICD-10-CM | POA: Insufficient documentation

## 2021-04-08 LAB — CBC WITH DIFFERENTIAL/PLATELET
Abs Immature Granulocytes: 0.02 10*3/uL (ref 0.00–0.07)
Basophils Absolute: 0.1 10*3/uL (ref 0.0–0.1)
Basophils Relative: 2 %
Eosinophils Absolute: 0.2 10*3/uL (ref 0.0–0.5)
Eosinophils Relative: 3 %
HCT: 41.5 % (ref 36.0–46.0)
Hemoglobin: 14.4 g/dL (ref 12.0–15.0)
Immature Granulocytes: 0 %
Lymphocytes Relative: 39 %
Lymphs Abs: 2.2 10*3/uL (ref 0.7–4.0)
MCH: 34.6 pg — ABNORMAL HIGH (ref 26.0–34.0)
MCHC: 34.7 g/dL (ref 30.0–36.0)
MCV: 99.8 fL (ref 80.0–100.0)
Monocytes Absolute: 0.5 10*3/uL (ref 0.1–1.0)
Monocytes Relative: 8 %
Neutro Abs: 2.7 10*3/uL (ref 1.7–7.7)
Neutrophils Relative %: 48 %
Platelets: 210 10*3/uL (ref 150–400)
RBC: 4.16 MIL/uL (ref 3.87–5.11)
RDW: 14.3 % (ref 11.5–15.5)
WBC: 5.7 10*3/uL (ref 4.0–10.5)
nRBC: 0 % (ref 0.0–0.2)

## 2021-04-08 LAB — COMPREHENSIVE METABOLIC PANEL
ALT: 22 U/L (ref 0–44)
AST: 26 U/L (ref 15–41)
Albumin: 4.3 g/dL (ref 3.5–5.0)
Alkaline Phosphatase: 86 U/L (ref 38–126)
Anion gap: 7 (ref 5–15)
BUN: 15 mg/dL (ref 6–20)
CO2: 29 mmol/L (ref 22–32)
Calcium: 9.6 mg/dL (ref 8.9–10.3)
Chloride: 102 mmol/L (ref 98–111)
Creatinine, Ser: 0.75 mg/dL (ref 0.44–1.00)
GFR, Estimated: >60 mL/min (ref >60)
Glucose, Bld: 108 mg/dL — ABNORMAL HIGH (ref 70–99)
Potassium: 4.6 mmol/L (ref 3.5–5.1)
Sodium: 138 mmol/L (ref 135–145)
Total Bilirubin: 0.6 mg/dL (ref 0.3–1.2)
Total Protein: 7.3 g/dL (ref 6.5–8.1)

## 2021-04-08 LAB — FERRITIN: Ferritin: 219 ng/mL (ref 11–307)

## 2021-04-08 NOTE — Progress Notes (Signed)
Hematology/Oncology Consult note [facility]  [phone] [phone]  Patient Care Team: Sallee Lange, NP as PCP - General (Internal Medicine) Sindy Guadeloupe, MD as Consulting Physician (Hematology and Oncology)   Name of the patient: Laura Haney  [id number]  May 27, 1960   Date of visit: 04/08/21  Diagnosis-history of hereditary hemochromatosis  Chief complaint/ Reason for visit-routine follow-up of hereditary hemochromatosis  Heme/Onc history: Patient is a 61 year old female who was previously seen by Dr. Mike Gip for history of hemochromatosis.  Genotype not known and prior records not available as per Dr. Kem Parkinson note.  Patient was diagnosed in [place] over 10 years ago.  She has been receiving phlebotomies in the past initially for a ferritin of less than 50.  Last phlebotomy was in March 2021.  She has a history of alcoholism but has been sober now for 16 months.  Hepatitis testing negative.  No underlying cirrhosis.  Interval history-reports that she continues to abstain from alcohol at this time.  Denies any specific complaints at this time  ECOG PS- 0 Pain scale- 0   Review of systems- Review of Systems  Constitutional:  Negative for chills, fever, malaise/fatigue and weight loss.  HENT:  Negative for congestion, ear discharge and nosebleeds.   Eyes:  Negative for blurred vision.  Respiratory:  Negative for cough, hemoptysis, sputum production, shortness of breath and wheezing.   Cardiovascular:  Negative for chest pain, palpitations, orthopnea and claudication.  Gastrointestinal:  Negative for abdominal pain, blood in stool, constipation, diarrhea, heartburn, melena, nausea and vomiting.  Genitourinary:  Negative for dysuria, flank pain, frequency, hematuria and urgency.  Musculoskeletal:  Negative for back pain, joint pain and myalgias.  Skin:  Negative for rash.  Neurological:  Negative for  dizziness, tingling, focal weakness, seizures, weakness and headaches.  Endo/Heme/Allergies:  Does not bruise/bleed easily.  Psychiatric/Behavioral:  Negative for depression and suicidal ideas. The patient does not have insomnia.       Allergies  Allergen Reactions   Hydrocodone Other (See Comments)    Makes patient angry   Penicillins Other (See Comments)    Bad yeast infections     Past Medical History:  Diagnosis Date   Bipolar 2 disorder ([employer])    Depression    Iron excess    Left hip pain      Past Surgical History:  Procedure Laterality Date   MULTIPLE TOOTH EXTRACTIONS  03/26/2020   TOTAL HIP ARTHROPLASTY Left 05/27/2020    Social History   Socioeconomic History   Marital status: Widowed    Spouse name: Not on file   Number of children: Not on file   Years of education: Not on file   Highest education level: Not on file  Occupational History   Not on file  Tobacco Use   Smoking status: Every Day    Packs/day: 1.50    Years: 40.00    Pack years: 60.00    Types: Cigarettes   Smokeless tobacco: Never  Vaping Use   Vaping Use: Never used  Substance and Sexual Activity   Alcohol use: Not Currently   Drug use: Not Currently    Comment: occasionally   Sexual activity: Yes  Other Topics Concern   Not on file  Social History Narrative   Not on file   Social Determinants of Health   Financial Resource Strain: Not on file  Food Insecurity: Not on file  Transportation Needs: Not on file  Physical Activity:  Not on file  Stress: Not on file  Social Connections: Not on file  Intimate Partner Violence: Not on file    Family History  Problem Relation Age of Onset   Heart disease Mother    Cancer Brother        pancreatic   Heart attack Paternal Grandfather      Current Outpatient Medications:    DULoxetine (CYMBALTA) 60 MG capsule, Take 1 capsule (60 mg total) by mouth daily., Disp: 30 capsule, Rfl: 1   hydrOXYzine (VISTARIL) 25 MG capsule, ,  Disp: , Rfl:    ibuprofen (ADVIL) 200 MG tablet, Take 200 mg by mouth every 6 (six) hours as needed., Disp: , Rfl:    Multiple Vitamin (MULTI-VITAMIN) tablet, Take 1 tablet by mouth daily., Disp: , Rfl:    QUEtiapine (SEROQUEL) 200 MG tablet, Take 1 tablet (200 mg total) by mouth at bedtime. (Patient taking differently: Take 200 mg by mouth at bedtime. Taking 1/2 tablet at night), Disp: 30 tablet, Rfl: 1   lamoTRIgine (LAMICTAL) 25 MG tablet, Take by mouth. Taking 1/2 daily, Disp: , Rfl:   Physical exam:  Vitals:   04/08/21 1012  BP: 114/74  Pulse: 79  Resp: 16  Temp: 97.9 F (36.6 C)  TempSrc: Tympanic  SpO2: 100%  Weight: 179 lb 6.4 oz (81.4 kg)  Height: 4' (1.219 m)   Physical Exam Constitutional:      General: She is not in acute distress. Cardiovascular:     Rate and Rhythm: Normal rate and regular rhythm.     Heart sounds: Normal heart sounds.  Pulmonary:     Effort: Pulmonary effort is normal.     Breath sounds: Normal breath sounds.  Abdominal:     General: Bowel sounds are normal.     Palpations: Abdomen is soft.  Skin:    General: Skin is warm and dry.  Neurological:     Mental Status: She is alert and oriented to person, place, and time.     CMP Latest Ref Rng & Units 04/08/2021  Glucose 70 - 99 mg/dL 108(H)  BUN 6 - 20 mg/dL 15  Creatinine 0.44 - 1.00 mg/dL 0.75  Sodium 135 - 145 mmol/L 138  Potassium 3.5 - 5.1 mmol/L 4.6  Chloride 98 - 111 mmol/L 102  CO2 22 - 32 mmol/L 29  Calcium 8.9 - 10.3 mg/dL 9.6  Total Protein 6.5 - 8.1 g/dL 7.3  Total Bilirubin 0.3 - 1.2 mg/dL 0.6  Alkaline Phos 38 - 126 U/L 86  AST 15 - 41 U/L 26  ALT 0 - 44 U/L 22   CBC Latest Ref Rng & Units 04/08/2021  WBC 4.0 - 10.5 K/uL 5.7  Hemoglobin 12.0 - 15.0 g/dL 14.4  Hematocrit 36.0 - 46.0 % 41.5  Platelets 150 - 400 K/uL 210     Assessment and plan- Patient is a 61 y.o. female with history of hereditary hemochromatosis here for routine follow-up  We presently do not have  records from [facility] to determine what was a genotype of her degree hemochromatosis.  I will try to obtain those at this time but if they are not available I would like to get HFE gene testing done at 6 months.  Currently goal ferritin is to keep it less than 100 and her ferritin came back at 219.  We will therefore have her come for weekly phlebotomies until ferritin is less than 100.  I will see her back in 6 months with CBC with  differential CMP AFP and right upper quadrant ultrasound prior   Visit Diagnosis 1. Hereditary hemochromatosis ([place])      Dr. Randa Evens, MD, MPH [facility] [id number] 04/08/2021 4:21 PM

## 2021-04-09 ENCOUNTER — Ambulatory Visit: Payer: Medicaid Other | Admitting: Oncology

## 2021-04-09 ENCOUNTER — Other Ambulatory Visit: Payer: Medicaid Other

## 2021-04-09 LAB — AFP TUMOR MARKER: AFP, Serum, Tumor Marker: 2.9 ng/mL (ref 0.0–9.2)

## 2021-10-03 ENCOUNTER — Ambulatory Visit: Payer: Medicaid Other

## 2021-10-14 ENCOUNTER — Inpatient Hospital Stay: Payer: Medicaid Other | Admitting: Oncology

## 2021-10-14 ENCOUNTER — Inpatient Hospital Stay: Payer: Medicaid Other
